# Patient Record
Sex: Female | Born: 1945 | Race: White | Hispanic: No | Marital: Single | Smoking: Never smoker
Health system: Southern US, Community
[De-identification: ages and names within clinical notes are randomized; demographics above are authoritative.]

## PROBLEM LIST (undated history)

## (undated) DIAGNOSIS — I495 Sick sinus syndrome: Secondary | ICD-10-CM

## (undated) DIAGNOSIS — I499 Cardiac arrhythmia, unspecified: Secondary | ICD-10-CM

## (undated) DIAGNOSIS — Z87898 Personal history of other specified conditions: Secondary | ICD-10-CM

## (undated) DIAGNOSIS — R55 Syncope and collapse: Secondary | ICD-10-CM

## (undated) DIAGNOSIS — S069X9A Unspecified intracranial injury with loss of consciousness of unspecified duration, initial encounter: Secondary | ICD-10-CM

---

## 2002-04-15 ENCOUNTER — Encounter: Admission: RE | Admit: 2002-04-15 | Discharge: 2002-04-15 | Payer: Self-pay | Admitting: Internal Medicine

## 2002-04-15 ENCOUNTER — Encounter: Payer: Self-pay | Admitting: Internal Medicine

## 2002-04-17 ENCOUNTER — Encounter: Admission: RE | Admit: 2002-04-17 | Discharge: 2002-04-17 | Payer: Self-pay | Admitting: Internal Medicine

## 2002-04-17 ENCOUNTER — Encounter: Payer: Self-pay | Admitting: Internal Medicine

## 2004-07-22 ENCOUNTER — Encounter: Admission: RE | Admit: 2004-07-22 | Discharge: 2004-07-22 | Payer: Self-pay | Admitting: Internal Medicine

## 2005-08-01 ENCOUNTER — Encounter: Admission: RE | Admit: 2005-08-01 | Discharge: 2005-08-01 | Payer: Self-pay | Admitting: Internal Medicine

## 2006-08-14 ENCOUNTER — Encounter: Admission: RE | Admit: 2006-08-14 | Discharge: 2006-08-14 | Payer: Self-pay | Admitting: Internal Medicine

## 2011-06-09 ENCOUNTER — Ambulatory Visit (INDEPENDENT_AMBULATORY_CARE_PROVIDER_SITE_OTHER): Payer: BC Managed Care – PPO

## 2011-06-09 DIAGNOSIS — J209 Acute bronchitis, unspecified: Secondary | ICD-10-CM

## 2013-09-27 ENCOUNTER — Ambulatory Visit: Payer: BC Managed Care – PPO

## 2013-09-27 ENCOUNTER — Ambulatory Visit (INDEPENDENT_AMBULATORY_CARE_PROVIDER_SITE_OTHER): Payer: BC Managed Care – PPO | Admitting: Family Medicine

## 2013-09-27 VITALS — BP 110/58 | HR 66 | Temp 97.6°F | Resp 16 | Ht 61.0 in | Wt 165.8 lb

## 2013-09-27 DIAGNOSIS — S9031XA Contusion of right foot, initial encounter: Secondary | ICD-10-CM

## 2013-09-27 DIAGNOSIS — S9030XA Contusion of unspecified foot, initial encounter: Secondary | ICD-10-CM

## 2013-09-27 DIAGNOSIS — M25579 Pain in unspecified ankle and joints of unspecified foot: Secondary | ICD-10-CM

## 2013-09-27 NOTE — Progress Notes (Signed)
[facility] [address], [place] [address] [phone]  Date:  09/27/2013   Name:  [name] Perkins   DOB:  05-Nov-1945   MRN:  [id number]  PCP:  No primary provider on file.    Chief Complaint: Foot Injury   History of Present Illness:  [name] Mckercher is a 68 y.o. very pleasant female patient who presents with the following:  Here today with a right foot injury. She dropped a wooden pallet on her right foot yesterday- she felt that her foot was a lot worse last night.  She wants to be sure all is ok and have an x-ray; however she feels that she is likely all right.  Otherwise she is unhurt and feeling well, she is generally quite healthy.    There are no active problems to display for this patient.   No past medical history on file.  No past surgical history on file.  History  Substance Use Topics  . Smoking status: Never Smoker   . Smokeless tobacco: Not on file  . Alcohol Use: Yes     Comment: WINE    Family History  Problem Relation Age of Onset  . Heart disease Father   . Cancer Sister   . Cancer Brother   . Cancer Maternal Grandmother   . Heart disease Maternal Grandfather   . Stroke Paternal Grandmother     No Known Allergies  Medication list has been reviewed and updated.  No current outpatient prescriptions on file prior to visit.   No current facility-administered medications on file prior to visit.    Review of Systems:  As per HPI- otherwise negative.   Physical Examination: Filed Vitals:   09/27/13 1635  BP: 110/58  Pulse: 66  Temp: 97.6 F (36.4 C)  Resp: 16   Filed Vitals:   09/27/13 1635  Height: 5\' 1"  (1.549 m)  Weight: 165 lb 12.8 oz (75.206 kg)   Body mass index is 31.34 kg/(m^2). Ideal Body Weight: Weight in (lb) to have BMI = 25: 132  GEN: WDWN, NAD, Non-toxic, A & O x 3, looks well HEENT: Atraumatic, Normocephalic. Neck supple. No masses, No LAD. Ears and Nose: No external deformity. CV: RRR,  No M/G/R. No JVD. No thrill. No extra heart sounds. PULM: CTA B, no wheezes, crackles, rhonchi. No retractions. No resp. distress. No accessory muscle use. EXTR: No c/c/e NEURO Normal gait. Is not favoring foot PSYCH: Normally interactive. Conversant. Not depressed or anxious appearing.  Calm demeanor.  Right foot: she has a contused and slightly tender area over the right 1st MCT. No swelling or bruise.  Ankle and knee negative/  No laceration  [facility] reading (PRIMARY) by  Dr. Patsy Lageropland. Right foot: negative   Assessment and Plan: Pain in joint, ankle and foot - Plan: DG Foot Complete Right  Contusion of right foot  Reassurance.  She does not desire crutches or other treatment.  She will let me know if not better soon- Sooner if worse.     Signed Abbe AmsterdamJessica Copland, MD

## 2013-09-27 NOTE — Patient Instructions (Signed)
Let us know if your foot does not continue to improve over next few days- ice and elevate when you can.

## 2014-05-19 ENCOUNTER — Other Ambulatory Visit (HOSPITAL_COMMUNITY): Payer: Self-pay | Admitting: Respiratory Therapy

## 2014-05-19 DIAGNOSIS — G4730 Sleep apnea, unspecified: Secondary | ICD-10-CM

## 2014-05-20 ENCOUNTER — Ambulatory Visit: Payer: Medicare Other | Attending: Internal Medicine | Admitting: Sleep Medicine

## 2014-05-20 DIAGNOSIS — R0683 Snoring: Secondary | ICD-10-CM | POA: Diagnosis present

## 2014-05-20 DIAGNOSIS — G4730 Sleep apnea, unspecified: Secondary | ICD-10-CM

## 2014-05-20 DIAGNOSIS — G4700 Insomnia, unspecified: Secondary | ICD-10-CM | POA: Insufficient documentation

## 2014-05-23 NOTE — Sleep Study (Signed)
  [facility] Cleto Claggett A. Gerilyn Pilgrimoonquah, MD     [email or web address]        NOCTURNAL POLYSOMNOGRAM    LOCATION: SLEEP LAB FACILITY: [facility]   PHYSICIAN: Bernisha Verma A. Gerilyn Pilgrimoonquah, M.D.   DATE OF STUDY: 05/20/2014.   REFERRING PHYSICIAN: Margaretmary BayleyPreston Clark.   INDICATIONS: The patient is a 68 year old female who presents with witnessed apneas, snoring and insomnia.  MEDICATIONS:  Prior to Admission medications   Medication Sig Start Date End Date Taking? Authorizing Provider  Ascorbic Acid (VITAMIN C PO) Take by mouth as needed.    Historical Provider, MD  Multiple Vitamin (MULTIVITAMIN) tablet Take 1 tablet by mouth daily.    Historical Provider, MD  Pyridoxine HCl (VITAMIN B-6 PO) Take by mouth.    Historical Provider, MD      EPWORTH SLEEPINESS SCALE: 6.   BMI: 27.   ARCHITECTURAL SUMMARY: There were two recordings which had to be spliced together because the initial recording was associated with technical difficulties. The combined total recording time was 462 minutes. Sleep efficiency 94 %. Sleep latency 19 minutes. REM latency 48 minutes. Staging based on both recordings are follows: Stage NI 5 %, N2 59 % and N3 1.5 % and REM sleep 23 %.    RESPIRATORY DATA:  Baseline oxygen saturation is 98 %. The lowest saturation is 89 %. The diagnostic AHI is 4. The RDI is 4. The REM AHI is 7.  LIMB MOVEMENT SUMMARY: PLM index 4.   ELECTROCARDIOGRAM SUMMARY: Average heart rate is 63 with no significant dysrhythmias observed.   IMPRESSION:  1. Unremarkable nocturnal polysomnography.  Thanks for this referral.  Cleona Doubleday A. Gerilyn Pilgrimoonquah, M.D. Diplomat, Biomedical engineerAmerican Board of Sleep Medicine.

## 2014-06-23 ENCOUNTER — Encounter (HOSPITAL_COMMUNITY): Payer: Self-pay | Admitting: Emergency Medicine

## 2014-06-23 ENCOUNTER — Emergency Department (HOSPITAL_COMMUNITY): Payer: Medicare Other

## 2014-06-23 ENCOUNTER — Inpatient Hospital Stay (HOSPITAL_COMMUNITY)
Admission: EM | Admit: 2014-06-23 | Discharge: 2014-06-25 | DRG: 242 | Disposition: A | Discharge status: Home / Self Care | Payer: Medicare Other | Attending: General Surgery | Admitting: General Surgery

## 2014-06-23 DIAGNOSIS — S52501A Unspecified fracture of the lower end of right radius, initial encounter for closed fracture: Secondary | ICD-10-CM

## 2014-06-23 DIAGNOSIS — S065X0A Traumatic subdural hemorrhage without loss of consciousness, initial encounter: Secondary | ICD-10-CM | POA: Diagnosis present

## 2014-06-23 DIAGNOSIS — I455 Other specified heart block: Secondary | ICD-10-CM | POA: Diagnosis not present

## 2014-06-23 DIAGNOSIS — S065X9A Traumatic subdural hemorrhage with loss of consciousness of unspecified duration, initial encounter: Secondary | ICD-10-CM | POA: Diagnosis present

## 2014-06-23 DIAGNOSIS — E876 Hypokalemia: Secondary | ICD-10-CM | POA: Diagnosis present

## 2014-06-23 DIAGNOSIS — S065XAA Traumatic subdural hemorrhage with loss of consciousness status unknown, initial encounter: Secondary | ICD-10-CM

## 2014-06-23 DIAGNOSIS — Y92009 Unspecified place in unspecified non-institutional (private) residence as the place of occurrence of the external cause: Secondary | ICD-10-CM

## 2014-06-23 DIAGNOSIS — R001 Bradycardia, unspecified: Secondary | ICD-10-CM | POA: Diagnosis present

## 2014-06-23 DIAGNOSIS — Z950 Presence of cardiac pacemaker: Secondary | ICD-10-CM

## 2014-06-23 DIAGNOSIS — W109XXA Fall (on) (from) unspecified stairs and steps, initial encounter: Secondary | ICD-10-CM | POA: Diagnosis present

## 2014-06-23 DIAGNOSIS — W19XXXA Unspecified fall, initial encounter: Secondary | ICD-10-CM

## 2014-06-23 DIAGNOSIS — S62109A Fracture of unspecified carpal bone, unspecified wrist, initial encounter for closed fracture: Secondary | ICD-10-CM | POA: Diagnosis present

## 2014-06-23 DIAGNOSIS — R55 Syncope and collapse: Secondary | ICD-10-CM | POA: Diagnosis not present

## 2014-06-23 DIAGNOSIS — I469 Cardiac arrest, cause unspecified: Secondary | ICD-10-CM | POA: Diagnosis not present

## 2014-06-23 DIAGNOSIS — T07XXXA Unspecified multiple injuries, initial encounter: Secondary | ICD-10-CM | POA: Diagnosis present

## 2014-06-23 DIAGNOSIS — S62101A Fracture of unspecified carpal bone, right wrist, initial encounter for closed fracture: Secondary | ICD-10-CM

## 2014-06-23 DIAGNOSIS — R40241 Glasgow coma scale score 13-15: Secondary | ICD-10-CM | POA: Diagnosis present

## 2014-06-23 DIAGNOSIS — S0181XA Laceration without foreign body of other part of head, initial encounter: Secondary | ICD-10-CM | POA: Diagnosis present

## 2014-06-23 DIAGNOSIS — I495 Sick sinus syndrome: Secondary | ICD-10-CM | POA: Diagnosis present

## 2014-06-23 LAB — I-STAT CHEM 8, ED
BUN: 23 mg/dL (ref 6–23)
CALCIUM ION: 1.01 mmol/L — AB (ref 1.13–1.30)
Chloride: 108 mEq/L (ref 96–112)
Creatinine, Ser: 0.8 mg/dL (ref 0.50–1.10)
Glucose, Bld: 98 mg/dL (ref 70–99)
HCT: 40 % (ref 36.0–46.0)
Hemoglobin: 13.6 g/dL (ref 12.0–15.0)
Potassium: 3.2 mmol/L — ABNORMAL LOW (ref 3.5–5.1)
SODIUM: 138 mmol/L (ref 135–145)
TCO2: 23 mmol/L (ref 0–100)

## 2014-06-23 LAB — I-STAT TROPONIN, ED: TROPONIN I, POC: 0 ng/mL (ref 0.00–0.08)

## 2014-06-23 MED ORDER — METOCLOPRAMIDE HCL 5 MG/ML IJ SOLN
10.0000 mg | Freq: Once | INTRAMUSCULAR | Status: AC
  Administered 2014-06-24: 10 mg via INTRAVENOUS
  Filled 2014-06-23: qty 2

## 2014-06-23 MED ORDER — HYDROMORPHONE HCL 1 MG/ML IJ SOLN
0.5000 mg | Freq: Once | INTRAMUSCULAR | Status: AC
  Administered 2014-06-23: 0.5 mg via INTRAVENOUS
  Filled 2014-06-23: qty 1

## 2014-06-23 MED ORDER — DIPHENHYDRAMINE HCL 50 MG/ML IJ SOLN
12.5000 mg | Freq: Once | INTRAMUSCULAR | Status: AC
  Administered 2014-06-24: 12.5 mg via INTRAVENOUS
  Filled 2014-06-23: qty 1

## 2014-06-23 MED ORDER — ONDANSETRON HCL 4 MG/2ML IJ SOLN
4.0000 mg | Freq: Once | INTRAMUSCULAR | Status: AC
  Administered 2014-06-23: 4 mg via INTRAVENOUS
  Filled 2014-06-23: qty 2

## 2014-06-23 NOTE — ED Provider Notes (Signed)
CSN: [id number]     Arrival date & time 06/23/14  2233 History   First MD Initiated Contact with Patient 06/23/14 2251     Chief Complaint  Patient presents with  . Fall     (Consider location/radiation/quality/duration/timing/severity/associated sxs/prior Treatment) HPI Comments: This is a 69 year old female who lives alone.  She called a friend after falling down 10 wooden steps in her house, does not recall the exact circumstances of her fall.  She has not been feeling ill.  She denies any chest pain, shortness of breath, fever, URI symptoms at this time.  She has a laceration over her right eyebrow that has been dressed by EMS.  She also has right wrist pain Patient denies back pain, dizziness, visual change, headache, abdominal pain, pain in her legs, hips or back  Patient is a 69 y.o. female presenting with fall. The history is provided by the patient.  Fall This is a new problem. The current episode started today. The problem occurs constantly. The problem has been unchanged. Associated symptoms include nausea. Pertinent negatives include no abdominal pain, change in bowel habit, chest pain, congestion, coughing, fever, headaches, neck pain, numbness, vertigo, visual change or weakness. The treatment provided no relief.    History reviewed. No pertinent past medical history. History reviewed. No pertinent past surgical history. Family History  Problem Relation Age of Onset  . Heart disease Father   . Cancer Sister   . Cancer Brother   . Cancer Maternal Grandmother   . Heart disease Maternal Grandfather   . Stroke Paternal Grandmother    History  Substance Use Topics  . Smoking status: Never Smoker   . Smokeless tobacco: Not on file  . Alcohol Use: Yes     Comment: WINE   OB History    No data available     Review of Systems  Constitutional: Negative for fever.  HENT: Negative for congestion and rhinorrhea.   Respiratory: Negative for cough.   Cardiovascular: Negative  for chest pain.  Gastrointestinal: Positive for nausea. Negative for abdominal pain and change in bowel habit.  Musculoskeletal: Negative for back pain and neck pain.  Skin: Positive for wound.  Neurological: Negative for dizziness, vertigo, weakness, numbness and headaches.  All other systems reviewed and are negative.     Allergies  Review of patient's allergies indicates no known allergies.  Home Medications   Prior to Admission medications   Medication Sig Start Date End Date Taking? Authorizing Provider  Ascorbic Acid (VITAMIN C PO) Take by mouth as needed.    Historical Provider, MD  Multiple Vitamin (MULTIVITAMIN) tablet Take 1 tablet by mouth daily.    Historical Provider, MD  Pyridoxine HCl (VITAMIN B-6 PO) Take by mouth.    Historical Provider, MD   BP 120/55 mmHg  Pulse 89  Temp(Src) 97.3 F (36.3 C) (Oral)  Resp 12  SpO2 100% Physical Exam  Constitutional: She is oriented to person, place, and time. She appears well-developed and well-nourished.  HENT:  Head: Normocephalic.  Eyes: Pupils are equal, round, and reactive to light.  Neck:  In C Collar  Cardiovascular: Normal rate.   Pulmonary/Chest: Effort normal and breath sounds normal. She has no wheezes. She exhibits no tenderness.  Abdominal: Soft. Bowel sounds are normal. There is no tenderness.  Musculoskeletal:       Right wrist: She exhibits decreased range of motion and swelling.  Neurological: She is alert and oriented to person, place, and time.  Skin: Skin is warm.  Nursing note and vitals reviewed.   ED Course  LACERATION REPAIR Date/Time: 06/24/2014 2:07 AM Performed by: Arman Filter Authorized by: Arman Filter Consent: Verbal consent obtained. Written consent not obtained. Risks and benefits: risks, benefits and alternatives were discussed Consent given by: patient Patient understanding: patient states understanding of the procedure being performed Patient identity confirmed:  verbally with patient Time out: Immediately prior to procedure a "time out" was called to verify the correct patient, procedure, equipment, support staff and site/side marked as required. Body area: head/neck Laceration length: 3 cm Foreign bodies: unknown Tendon involvement: none Nerve involvement: none Vascular damage: no Anesthesia: local infiltration Local anesthetic: lidocaine 1% without epinephrine Anesthetic total: 2 ml Patient sedated: no Preparation: Patient was prepped and draped in the usual sterile fashion. Irrigation solution: saline Amount of cleaning: standard Debridement: none Degree of undermining: none Skin closure: 6-0 Prolene Subcutaneous closure: 4-0 Vicryl Number of sutures: 10 Technique: simple Approximation: close Approximation difficulty: simple Dressing: antibiotic ointment Patient tolerance: Patient tolerated the procedure well with no immediate complications   (including critical care time) Labs Review Labs Reviewed  CBC WITH DIFFERENTIAL - Abnormal; Notable for the following:    WBC 13.2 (*)    Neutrophils Relative % 91 (*)    Lymphocytes Relative 6 (*)    Neutro Abs 12.0 (*)    All other components within normal limits  I-STAT CHEM 8, ED - Abnormal; Notable for the following:    Potassium 3.2 (*)    Calcium, Ion 1.01 (*)    All other components within normal limits  PROTIME-INR  HEPATIC FUNCTION PANEL  I-STAT TROPOININ, ED    Imaging Review Dg Wrist Complete Right  06/23/2014   CLINICAL DATA:  Right wrist pain, bruising, deformity. Fall at home. Initial encounter.  EXAM: RIGHT WRIST - COMPLETE 3+ VIEW  COMPARISON:  None.  FINDINGS: Comminuted intra-articular distal radius fracture extending into the distal radial ulnar and radiocarpal joints. There is 1/2 shaft with dorsal displacement of distal fracture fragments and mild apex volar angulation. There is a mildly displaced ulnar styloid fracture. Associated soft tissue edema is seen about the  wrist. Carpal alignment is maintained.  IMPRESSION: Comminuted displaced angulated distal radius fracture extending into the distal radial ulnar and radiocarpal joints. Mildly displaced ulna styloid fracture.   Electronically Signed   By: Rubye Oaks M.D.   On: 06/23/2014 23:55   Ct Head Wo Contrast  06/24/2014   CLINICAL DATA:  Larey Seat down 10 steps at house today. Patient does not recall accident.  EXAM: CT HEAD WITHOUT CONTRAST  CT CERVICAL SPINE WITHOUT CONTRAST  TECHNIQUE: Multidetector CT imaging of the head and cervical spine was performed following the standard protocol without intravenous contrast. Multiplanar CT image reconstructions of the cervical spine were also generated.  COMPARISON:  None.  FINDINGS: CT HEAD FINDINGS  The ventricles and sulci are normal for age. No intraparenchymal hemorrhage, mass effect nor midline shift. Patchy supratentorial white matter hypodensities are within normal range for patient's age and though non-specific suggest sequelae of chronic small vessel ischemic disease. No acute large vascular territory infarcts.  3 mm crescentic density within RIGHT frontal extra-axial space, axial 15/31. Basal cisterns are patent. Moderate calcific atherosclerosis of the carotid siphons.  Moderate RIGHT frontal scalp hematoma with subcutaneous gas, no radiopaque foreign bodies. No skull fracture. The included ocular globes and orbital contents are non-suspicious. The mastoid aircells and included paranasal sinuses are well-aerated.  CT CERVICAL SPINE FINDINGS  Cervical vertebral bodies  and posterior elements are intact and aligned. Straightened cervical lordosis. Moderate to severe C6-7 degenerative disc, moderate at C4-5 and C5-6. No destructive bony lesions. C1-2 articulation maintained with moderate arthropathy. Calcified longus colli insertion. No destructive bony lesions.  Broad-based disc osteophyte complex, mild facet arthropathy without osseous canal stenosis. Severe LEFT C5-6,  moderate to severe RIGHT C6-7 neural foraminal narrowing.  IMPRESSION: CT HEAD: 3 mm RIGHT frontal extra-axial density, though this may reflect a cortical vein, in the setting of trauma, subdural hematoma is a concern. No skull fracture.  Moderate RIGHT frontal scalp hematoma, laceration.  CT CERVICAL SPINE: Straightened cervical lordosis without acute fracture or malalignment.  Acute findings discussed with and reconfirmed by [facility] Jost on 06/23/2014 at 12:00 am.   Electronically Signed   By: Awilda Metroourtnay  Bloomer   On: 06/24/2014 00:00   Ct Cervical Spine Wo Contrast  06/24/2014   CLINICAL DATA:  Larey SeatFell down 10 steps at house today. Patient does not recall accident.  EXAM: CT HEAD WITHOUT CONTRAST  CT CERVICAL SPINE WITHOUT CONTRAST  TECHNIQUE: Multidetector CT imaging of the head and cervical spine was performed following the standard protocol without intravenous contrast. Multiplanar CT image reconstructions of the cervical spine were also generated.  COMPARISON:  None.  FINDINGS: CT HEAD FINDINGS  The ventricles and sulci are normal for age. No intraparenchymal hemorrhage, mass effect nor midline shift. Patchy supratentorial white matter hypodensities are within normal range for patient's age and though non-specific suggest sequelae of chronic small vessel ischemic disease. No acute large vascular territory infarcts.  3 mm crescentic density within RIGHT frontal extra-axial space, axial 15/31. Basal cisterns are patent. Moderate calcific atherosclerosis of the carotid siphons.  Moderate RIGHT frontal scalp hematoma with subcutaneous gas, no radiopaque foreign bodies. No skull fracture. The included ocular globes and orbital contents are non-suspicious. The mastoid aircells and included paranasal sinuses are well-aerated.  CT CERVICAL SPINE FINDINGS  Cervical vertebral bodies and posterior elements are intact and aligned. Straightened cervical lordosis. Moderate to severe C6-7 degenerative disc, moderate at C4-5  and C5-6. No destructive bony lesions. C1-2 articulation maintained with moderate arthropathy. Calcified longus colli insertion. No destructive bony lesions.  Broad-based disc osteophyte complex, mild facet arthropathy without osseous canal stenosis. Severe LEFT C5-6, moderate to severe RIGHT C6-7 neural foraminal narrowing.  IMPRESSION: CT HEAD: 3 mm RIGHT frontal extra-axial density, though this may reflect a cortical vein, in the setting of trauma, subdural hematoma is a concern. No skull fracture.  Moderate RIGHT frontal scalp hematoma, laceration.  CT CERVICAL SPINE: Straightened cervical lordosis without acute fracture or malalignment.  Acute findings discussed with and reconfirmed by Dr.Aleni Andrus on 06/23/2014 at 12:00 am.   Electronically Signed   By: Awilda Metroourtnay  Bloomer   On: 06/24/2014 00:00     EKG Interpretation   Date/Time:  Wednesday June 24 2014 00:26:20 EST Ventricular Rate:  91 PR Interval:  243 QRS Duration: 72 QT Interval:  412 QTC Calculation: 507 R Axis:   70 Text Interpretation:  Sinus rhythm Prolonged PR interval Prolonged QT  interval Confirmed by Erroll Lunani, Adeleke Ayokunle [phone]([id number]) on 06/24/2014 2:11:08 AM     I spoke with Dr. Merlyn LotKuzma requests a sugar tong splint be placed in sling.  He will evaluate the patient in the morning.  Potential surgical repair of her fracture.  She is neurovascularly intact. I spoke with Dr. Magnus IvanBlackman who will admit the patient to trauma because of multisystem injury Spoke with Dr. Toula MoosNundkumar-neurosurgery.  He states the patient will  need to be admitted overnight with frequent neuro checks and reevaluated in the morning.  Most likely with a repeat CT scan to evaluate subdural hematoma MDM   Final diagnoses:  Fall at home, initial encounter  Subdural hematoma  Wrist fracture, right, closed, initial encounter  Facial laceration, initial encounter         Arman Filter, NP 06/24/14 1610  Tomasita Crumble, MD 06/24/14 [phone]

## 2014-06-23 NOTE — ED Notes (Signed)
Pt arrives via [facility] on back board with c/collar and head blocks. Pt fell down about 10 wooden steps at her house, only remember working on a puzzle beforehand, unsure of why she fell. Pt has bandaged lac above rt eye and rt wrist splinted. Pt alert, oriented x4. Iv in place, 4mg  zofran given via ems, pt still c/o nausea.

## 2014-06-23 NOTE — ED Notes (Signed)
PA at bedside.

## 2014-06-23 NOTE — ED Notes (Signed)
Pt back to room from radiology, vomiting at this time. PA made aware.

## 2014-06-24 ENCOUNTER — Inpatient Hospital Stay (HOSPITAL_COMMUNITY): Payer: Medicare Other

## 2014-06-24 ENCOUNTER — Encounter (HOSPITAL_COMMUNITY): Payer: Self-pay | Admitting: Internal Medicine

## 2014-06-24 ENCOUNTER — Encounter (HOSPITAL_COMMUNITY): Admission: EM | Disposition: A | Discharge status: Home / Self Care | Payer: Self-pay | Source: Home / Self Care

## 2014-06-24 DIAGNOSIS — R001 Bradycardia, unspecified: Secondary | ICD-10-CM | POA: Diagnosis present

## 2014-06-24 DIAGNOSIS — T07XXXA Unspecified multiple injuries, initial encounter: Secondary | ICD-10-CM | POA: Diagnosis present

## 2014-06-24 DIAGNOSIS — Y92009 Unspecified place in unspecified non-institutional (private) residence as the place of occurrence of the external cause: Secondary | ICD-10-CM | POA: Diagnosis not present

## 2014-06-24 DIAGNOSIS — I495 Sick sinus syndrome: Secondary | ICD-10-CM

## 2014-06-24 DIAGNOSIS — I455 Other specified heart block: Principal | ICD-10-CM

## 2014-06-24 DIAGNOSIS — W109XXA Fall (on) (from) unspecified stairs and steps, initial encounter: Secondary | ICD-10-CM | POA: Diagnosis present

## 2014-06-24 DIAGNOSIS — I469 Cardiac arrest, cause unspecified: Secondary | ICD-10-CM | POA: Diagnosis not present

## 2014-06-24 DIAGNOSIS — E876 Hypokalemia: Secondary | ICD-10-CM | POA: Diagnosis present

## 2014-06-24 DIAGNOSIS — R55 Syncope and collapse: Secondary | ICD-10-CM | POA: Diagnosis present

## 2014-06-24 DIAGNOSIS — S065X0A Traumatic subdural hemorrhage without loss of consciousness, initial encounter: Secondary | ICD-10-CM | POA: Diagnosis present

## 2014-06-24 DIAGNOSIS — S52501A Unspecified fracture of the lower end of right radius, initial encounter for closed fracture: Secondary | ICD-10-CM | POA: Diagnosis present

## 2014-06-24 DIAGNOSIS — S0181XA Laceration without foreign body of other part of head, initial encounter: Secondary | ICD-10-CM | POA: Diagnosis present

## 2014-06-24 DIAGNOSIS — R40241 Glasgow coma scale score 13-15: Secondary | ICD-10-CM | POA: Diagnosis present

## 2014-06-24 HISTORY — PX: PERMANENT PACEMAKER INSERTION: SHX5480

## 2014-06-24 LAB — MRSA PCR SCREENING: MRSA by PCR: NEGATIVE

## 2014-06-24 LAB — PROTIME-INR
INR: 0.93 (ref 0.00–1.49)
Prothrombin Time: 12.6 seconds (ref 11.6–15.2)

## 2014-06-24 LAB — CBC WITH DIFFERENTIAL/PLATELET
BASOS ABS: 0 10*3/uL (ref 0.0–0.1)
Basophils Relative: 0 % (ref 0–1)
Eosinophils Absolute: 0 10*3/uL (ref 0.0–0.7)
Eosinophils Relative: 0 % (ref 0–5)
HCT: 40 % (ref 36.0–46.0)
HEMOGLOBIN: 13.5 g/dL (ref 12.0–15.0)
Lymphocytes Relative: 6 % — ABNORMAL LOW (ref 12–46)
Lymphs Abs: 0.8 10*3/uL (ref 0.7–4.0)
MCH: 30.6 pg (ref 26.0–34.0)
MCHC: 33.8 g/dL (ref 30.0–36.0)
MCV: 90.7 fL (ref 78.0–100.0)
Monocytes Absolute: 0.4 10*3/uL (ref 0.1–1.0)
Monocytes Relative: 3 % (ref 3–12)
NEUTROS ABS: 12 10*3/uL — AB (ref 1.7–7.7)
Neutrophils Relative %: 91 % — ABNORMAL HIGH (ref 43–77)
Platelets: 296 10*3/uL (ref 150–400)
RBC: 4.41 MIL/uL (ref 3.87–5.11)
RDW: 13 % (ref 11.5–15.5)
WBC: 13.2 10*3/uL — ABNORMAL HIGH (ref 4.0–10.5)

## 2014-06-24 LAB — HEPATIC FUNCTION PANEL
ALT: 15 U/L (ref 0–35)
AST: 43 U/L — ABNORMAL HIGH (ref 0–37)
Albumin: 4.2 g/dL (ref 3.5–5.2)
Alkaline Phosphatase: 68 U/L (ref 39–117)
BILIRUBIN INDIRECT: 0.5 mg/dL (ref 0.3–0.9)
BILIRUBIN TOTAL: 1 mg/dL (ref 0.3–1.2)
Bilirubin, Direct: 0.5 mg/dL — ABNORMAL HIGH (ref 0.0–0.3)
TOTAL PROTEIN: 6.7 g/dL (ref 6.0–8.3)

## 2014-06-24 SURGERY — PERMANENT PACEMAKER INSERTION

## 2014-06-24 MED ORDER — MIDAZOLAM HCL 5 MG/5ML IJ SOLN
INTRAMUSCULAR | Status: AC
  Filled 2014-06-24: qty 5

## 2014-06-24 MED ORDER — SODIUM CHLORIDE 0.9 % IV SOLN: 1.0000 g | Freq: Once | INTRAVENOUS | Status: DC

## 2014-06-24 MED ORDER — FENTANYL CITRATE 0.05 MG/ML IJ SOLN
INTRAMUSCULAR | Status: AC
  Filled 2014-06-24: qty 2

## 2014-06-24 MED ORDER — LIDOCAINE HCL (PF) 1 % IJ SOLN
5.0000 mL | Freq: Once | INTRAMUSCULAR | Status: AC
  Administered 2014-06-24: 5 mL
  Filled 2014-06-24: qty 5

## 2014-06-24 MED ORDER — SODIUM CHLORIDE 0.9 % IV SOLN: INTRAVENOUS | Status: DC

## 2014-06-24 MED ORDER — MORPHINE SULFATE 2 MG/ML IJ SOLN
2.0000 mg | INTRAMUSCULAR | Status: DC | PRN
  Administered 2014-06-25: 2 mg via INTRAVENOUS
  Filled 2014-06-24: qty 1

## 2014-06-24 MED ORDER — CEFAZOLIN SODIUM-DEXTROSE 2-3 GM-% IV SOLR
2.0000 g | INTRAVENOUS | Status: DC
  Filled 2014-06-24: qty 50

## 2014-06-24 MED ORDER — CEFAZOLIN SODIUM 1-5 GM-% IV SOLN
1.0000 g | Freq: Four times a day (QID) | INTRAVENOUS | Status: DC
  Administered 2014-06-24 – 2014-06-25 (×2): 1 g via INTRAVENOUS
  Filled 2014-06-24 (×3): qty 50

## 2014-06-24 MED ORDER — ONDANSETRON HCL 4 MG/2ML IJ SOLN: 4.0000 mg | Freq: Four times a day (QID) | INTRAMUSCULAR | Status: DC | PRN

## 2014-06-24 MED ORDER — CHLORHEXIDINE GLUCONATE 4 % EX LIQD
60.0000 mL | Freq: Once | CUTANEOUS | Status: AC
Start: 2014-06-24 — End: 2014-06-24
  Filled 2014-06-24: qty 60

## 2014-06-24 MED ORDER — HYDROMORPHONE HCL 1 MG/ML IJ SOLN
0.5000 mg | Freq: Once | INTRAMUSCULAR | Status: AC
  Administered 2014-06-24: 0.5 mg via INTRAVENOUS
  Filled 2014-06-24: qty 1

## 2014-06-24 MED ORDER — CETYLPYRIDINIUM CHLORIDE 0.05 % MT LIQD: 7.0000 mL | Freq: Two times a day (BID) | OROMUCOSAL | Status: DC

## 2014-06-24 MED ORDER — SODIUM CHLORIDE 0.9 % IV SOLN
1.0000 g | Freq: Once | INTRAVENOUS | Status: AC
  Administered 2014-06-24: 1 g via INTRAVENOUS
  Filled 2014-06-24: qty 10

## 2014-06-24 MED ORDER — GENTAMICIN SULFATE 40 MG/ML IJ SOLN
80.0000 mg | INTRAMUSCULAR | Status: DC
  Filled 2014-06-24 (×2): qty 2

## 2014-06-24 MED ORDER — HYDROMORPHONE HCL 1 MG/ML IJ SOLN: 1.0000 mg | INTRAMUSCULAR | Status: DC | PRN

## 2014-06-24 MED ORDER — HEPARIN (PORCINE) IN NACL 2-0.9 UNIT/ML-% IJ SOLN
INTRAMUSCULAR | Status: AC
  Filled 2014-06-24: qty 500

## 2014-06-24 MED ORDER — SODIUM CHLORIDE 0.9 % IV BOLUS (SEPSIS)
250.0000 mL | Freq: Once | INTRAVENOUS | Status: AC
  Administered 2014-06-24: 250 mL via INTRAVENOUS

## 2014-06-24 MED ORDER — ACETAMINOPHEN 325 MG PO TABS
325.0000 mg | ORAL_TABLET | ORAL | Status: DC | PRN
  Administered 2014-06-24: 650 mg via ORAL
  Filled 2014-06-24: qty 2

## 2014-06-24 MED ORDER — LIDOCAINE HCL (PF) 1 % IJ SOLN
INTRAMUSCULAR | Status: AC
  Filled 2014-06-24: qty 60

## 2014-06-24 MED ORDER — ONDANSETRON HCL 4 MG PO TABS: 4.0000 mg | ORAL_TABLET | Freq: Four times a day (QID) | ORAL | Status: DC | PRN

## 2014-06-24 MED ORDER — CHLORHEXIDINE GLUCONATE 4 % EX LIQD
60.0000 mL | Freq: Once | CUTANEOUS | Status: AC
Start: 2014-06-24 — End: 2014-06-24
  Administered 2014-06-24: 4 via TOPICAL
  Filled 2014-06-24: qty 60

## 2014-06-24 MED ORDER — POTASSIUM CHLORIDE IN NACL 20-0.9 MEQ/L-% IV SOLN
INTRAVENOUS | Status: DC
  Administered 2014-06-24 (×2): via INTRAVENOUS
  Filled 2014-06-24 (×5): qty 1000

## 2014-06-24 MED ORDER — MORPHINE SULFATE 2 MG/ML IJ SOLN: 1.0000 mg | INTRAMUSCULAR | Status: DC | PRN

## 2014-06-24 MED ORDER — MORPHINE SULFATE 4 MG/ML IJ SOLN: 4.0000 mg | INTRAMUSCULAR | Status: DC | PRN

## 2014-06-24 NOTE — ED Notes (Signed)
Suture cart placed at bedside. 

## 2014-06-24 NOTE — Progress Notes (Signed)
Patient had a significant bradycardia episode earlier this AM.  Will give cardiology a call for consultation.  This patient has been seen and I agree with the findings and treatment plan.  Marta LamasJames O. Gae BonWyatt, III, MD, FACS [phone] (pager) [phone] (direct pager) Trauma Surgeon

## 2014-06-24 NOTE — CV Procedure (Signed)
SURGEON:  Lewayne BuntingGregg Marvis Saefong, MD     PREPROCEDURE DIAGNOSIS:  Symptomatic Bradycardia due to sinus node dysfunction    POSTPROCEDURE DIAGNOSIS:  Same as preprocedure diagnosis     PROCEDURES:   1.  Pacemaker implantation.     INTRODUCTION: [name] is a 69 y.o. female  with a history of bradycardia who presents today for pacemaker implantation.  The patient reports intermittent episodes of dizziness over the past few months.  No reversible causes have been identified.  She has had a 16 second pause with no reversible causes. The patient therefore presents today for pacemaker implantation.     DESCRIPTION OF PROCEDURE:  Informed written consent was obtained, and   the patient was brought to the electrophysiology lab in a fasting state.  The patient required no sedation for the procedure today.  The patients left chest was prepped and draped in the usual sterile fashion by the EP lab staff. The skin overlying the left deltopectoral region was infiltrated with lidocaine for local analgesia.  A 4-cm incision was made over the left deltopectoral region.  A left subcutaneous pacemaker pocket was fashioned using a combination of sharp and blunt dissection. Electrocautery was required to assure hemostasis.     RA/RV Lead Placement: The left axillary vein was therefore directly visualized and cannulated.  Through the left axillary vein, a St. Jude [id number] (serial number [id number]) right atrial lead and a [employer]. Jude (serial number [id number]) right ventricular lead were advanced with fluoroscopic visualization into the right atrial appendage and right ventricular apical septal positions respectively.  Initial atrial lead P- waves measured 2 mV with impedance of 415 ohms and a threshold of 0.6 V at 0.5 msec.  Right ventricular lead R-waves measured 8 mV with an impedance of 737 ohms and a threshold of 0.9 V at 0.5 msec.  Both leads were secured to the pectoralis fascia using #2-0 silk over the suture sleeves.    Device Placement:  The leads were then connected to a St. Jude (serial number [id number]) pacemaker.  The pocket was irrigated with copious gentamicin solution.  The pacemaker was then placed into the pocket.  The pocket was then closed in 2 layers with 2.0 Vicryl suture for the subcutaneous and subcuticular layers.  Steri-Strips and a sterile dressing were then applied.  There were no early apparent complications.     CONCLUSIONS:   1. Successful implantation of a St. Jude dual-chamber pacemaker for symptomatic bradycardia due to sinus node dysfunction.  2. No early apparent complications.           Lewayne BuntingGregg Darriel Sinquefield, MD 06/24/2014 6:27 PM

## 2014-06-24 NOTE — Consult Note (Signed)
 Reason for Consult: Syncope/ 16 second aystole Referring Physician: Dr. Cooper   HPI: The patient is a 69 y/o female with no prior cardiac history and no significant PMH who presented to [facility] 06/23/14 after sustaining a syncopal episode yesterday at home that resulted in head and right upper extremity trauma. She was home alone and the event was unwitnessed. She recalls that she was in her usual state of health and denies any preceding symptoms. She denies palpitations, dizziness, lightheaded, CP, dyspnea and no presyncope. She denies any prior history of syncope. She was walking up her steps to her second floor bedroom when the event occurred. When she regained consciousness, she was bleeding from her head. She called 911 and was transported to [facility].   On arrival, she was noted to have a small subdural hematoma and a right wrist fracture.  Per admission records, she was in SR with stable HR in the 90s. BP was also stable. She was monitored overnight on telemetry. At 10:08 am today, she was observed to have asystole for a duration of 16.8 seconds. During that time she recalls feeling "poorly". She felt nauseated but no vomiting. Symptoms have since resolved. EP has been consulted for consideration for PPM.  She is currently stable and in NSR with HR in the 70s. She is asymptomatic. No further bradycardia/asystole since episode earlier today. She is not on any AV nodal blocking agents. She had mild hypokalemia on admission at 3.2 but not severe enough to cause this degree of bradycardia. External pacer pads are in place.     History reviewed. No pertinent past medical history.  History reviewed. No pertinent past surgical history.  Family History  Problem Relation Age of Onset  . Heart disease Father   . Cancer Sister   . Cancer Brother   . Cancer Maternal Grandmother   . Heart disease Maternal Grandfather   . Stroke Paternal Grandmother     Social History:  reports that she has never  smoked. She does not have any smokeless tobacco history on file. She reports that she drinks alcohol. Her drug history is not on file.  Allergies: No Known Allergies  Medications: Current Facility-Administered Medications  Medication Dose Route Frequency Provider Last Rate Last Dose  . 0.9 % NaCl with KCl 20 mEq/ L  infusion   Intravenous Continuous Douglas Blackman, MD 75 mL/hr at 06/24/14 0522    . HYDROmorphone (DILAUDID) injection 1 mg  1 mg Intravenous Q4H PRN Douglas Blackman, MD      . morphine 2 MG/ML injection 2 mg  2 mg Intravenous Q1H PRN Douglas Blackman, MD      . morphine 4 MG/ML injection 4 mg  4 mg Intravenous Q1H PRN Douglas Blackman, MD      . ondansetron (ZOFRAN) tablet 4 mg  4 mg Oral Q6H PRN Douglas Blackman, MD       Or  . ondansetron (ZOFRAN) injection 4 mg  4 mg Intravenous Q6H PRN Douglas Blackman, MD         Results for orders placed or performed during the hospital encounter of 06/23/14 (from the past 48 hour(s))  I-stat chem 8, ed     Status: Abnormal   Collection Time: 06/23/14 11:21 PM  Result Value Ref Range   Sodium 138 135 - 145 mmol/L   Potassium 3.2 (L) 3.5 - 5.1 mmol/L   Chloride 108 96 - 112 mEq/L   BUN 23 6 - 23 mg/dL   Creatinine, Ser 0.80 0.50 -   1.10 mg/dL   Glucose, Bld 98 70 - 99 mg/dL   Calcium, Ion 1.01 (L) 1.13 - 1.30 mmol/L   TCO2 23 0 - 100 mmol/L   Hemoglobin 13.6 12.0 - 15.0 g/dL   HCT 40.0 36.0 - 46.0 %  I-stat troponin, ED     Status: None   Collection Time: 06/23/14 11:23 PM  Result Value Ref Range   Troponin i, poc 0.00 0.00 - 0.08 ng/mL   Comment 3            Comment: Due to the release kinetics of cTnI, a negative result within the first hours of the onset of symptoms does not rule out myocardial infarction with certainty. If myocardial infarction is still suspected, repeat the test at appropriate intervals.   CBC with Differential     Status: Abnormal   Collection Time: 06/24/14 12:34 AM  Result Value Ref Range    WBC 13.2 (H) 4.0 - 10.5 K/uL    Comment: WHITE COUNT CONFIRMED ON SMEAR   RBC 4.41 3.87 - 5.11 MIL/uL   Hemoglobin 13.5 12.0 - 15.0 g/dL   HCT 40.0 36.0 - 46.0 %   MCV 90.7 78.0 - 100.0 fL   MCH 30.6 26.0 - 34.0 pg   MCHC 33.8 30.0 - 36.0 g/dL   RDW 13.0 11.5 - 15.5 %   Platelets 296 150 - 400 K/uL   Neutrophils Relative % 91 (H) 43 - 77 %   Lymphocytes Relative 6 (L) 12 - 46 %   Monocytes Relative 3 3 - 12 %   Eosinophils Relative 0 0 - 5 %   Basophils Relative 0 0 - 1 %   Neutro Abs 12.0 (H) 1.7 - 7.7 K/uL   Lymphs Abs 0.8 0.7 - 4.0 K/uL   Monocytes Absolute 0.4 0.1 - 1.0 K/uL   Eosinophils Absolute 0.0 0.0 - 0.7 K/uL   Basophils Absolute 0.0 0.0 - 0.1 K/uL   WBC Morphology      MODERATE LEFT SHIFT (>5% METAS AND MYELOS,OCC PRO NOTED)  Protime-INR     Status: None   Collection Time: 06/24/14 12:34 AM  Result Value Ref Range   Prothrombin Time 12.6 11.6 - 15.2 seconds   INR 0.93 0.00 - 1.49  Hepatic function panel     Status: Abnormal   Collection Time: 06/24/14 12:34 AM  Result Value Ref Range   Total Protein 6.7 6.0 - 8.3 g/dL   Albumin 4.2 3.5 - 5.2 g/dL   AST 43 (H) 0 - 37 U/L   ALT 15 0 - 35 U/L   Alkaline Phosphatase 68 39 - 117 U/L   Total Bilirubin 1.0 0.3 - 1.2 mg/dL   Bilirubin, Direct 0.5 (H) 0.0 - 0.3 mg/dL   Indirect Bilirubin 0.5 0.3 - 0.9 mg/dL  MRSA PCR Screening     Status: None   Collection Time: 06/24/14  5:00 AM  Result Value Ref Range   MRSA by PCR NEGATIVE NEGATIVE    Comment:        The GeneXpert MRSA Assay (FDA approved for NASAL specimens only), is one component of a comprehensive MRSA colonization surveillance program. It is not intended to diagnose MRSA infection nor to guide or monitor treatment for MRSA infections.     Dg Wrist Complete Right  06/23/2014   CLINICAL DATA:  Right wrist pain, bruising, deformity. Fall at home. Initial encounter.  EXAM: RIGHT WRIST - COMPLETE 3+ VIEW  COMPARISON:  None.  FINDINGS: Comminuted  intra-articular distal   radius fracture extending into the distal radial ulnar and radiocarpal joints. There is 1/2 shaft with dorsal displacement of distal fracture fragments and mild apex volar angulation. There is a mildly displaced ulnar styloid fracture. Associated soft tissue edema is seen about the wrist. Carpal alignment is maintained.  IMPRESSION: Comminuted displaced angulated distal radius fracture extending into the distal radial ulnar and radiocarpal joints. Mildly displaced ulna styloid fracture.   Electronically Signed   By: Melanie  Ehinger M.D.   On: 06/23/2014 23:55   Ct Head Wo Contrast  06/24/2014   CLINICAL DATA:  Fell down 10 steps at house today. Patient does not recall accident.  EXAM: CT HEAD WITHOUT CONTRAST  CT CERVICAL SPINE WITHOUT CONTRAST  TECHNIQUE: Multidetector CT imaging of the head and cervical spine was performed following the standard protocol without intravenous contrast. Multiplanar CT image reconstructions of the cervical spine were also generated.  COMPARISON:  None.  FINDINGS: CT HEAD FINDINGS  The ventricles and sulci are normal for age. No intraparenchymal hemorrhage, mass effect nor midline shift. Patchy supratentorial white matter hypodensities are within normal range for patient's age and though non-specific suggest sequelae of chronic small vessel ischemic disease. No acute large vascular territory infarcts.  3 mm crescentic density within RIGHT frontal extra-axial space, axial 15/31. Basal cisterns are patent. Moderate calcific atherosclerosis of the carotid siphons.  Moderate RIGHT frontal scalp hematoma with subcutaneous gas, no radiopaque foreign bodies. No skull fracture. The included ocular globes and orbital contents are non-suspicious. The mastoid aircells and included paranasal sinuses are well-aerated.  CT CERVICAL SPINE FINDINGS  Cervical vertebral bodies and posterior elements are intact and aligned. Straightened cervical lordosis. Moderate to severe C6-7  degenerative disc, moderate at C4-5 and C5-6. No destructive bony lesions. C1-2 articulation maintained with moderate arthropathy. Calcified longus colli insertion. No destructive bony lesions.  Broad-based disc osteophyte complex, mild facet arthropathy without osseous canal stenosis. Severe LEFT C5-6, moderate to severe RIGHT C6-7 neural foraminal narrowing.  IMPRESSION: CT HEAD: 3 mm RIGHT frontal extra-axial density, though this may reflect a cortical vein, in the setting of trauma, subdural hematoma is a concern. No skull fracture.  Moderate RIGHT frontal scalp hematoma, laceration.  CT CERVICAL SPINE: Straightened cervical lordosis without acute fracture or malalignment.  Acute findings discussed with and reconfirmed by Dr.GAIL SCHULZ on 06/23/2014 at 12:00 am.   Electronically Signed   By: Courtnay  Bloomer   On: 06/24/2014 00:00   Ct Cervical Spine Wo Contrast  06/24/2014   CLINICAL DATA:  Fell down 10 steps at house today. Patient does not recall accident.  EXAM: CT HEAD WITHOUT CONTRAST  CT CERVICAL SPINE WITHOUT CONTRAST  TECHNIQUE: Multidetector CT imaging of the head and cervical spine was performed following the standard protocol without intravenous contrast. Multiplanar CT image reconstructions of the cervical spine were also generated.  COMPARISON:  None.  FINDINGS: CT HEAD FINDINGS  The ventricles and sulci are normal for age. No intraparenchymal hemorrhage, mass effect nor midline shift. Patchy supratentorial white matter hypodensities are within normal range for patient's age and though non-specific suggest sequelae of chronic small vessel ischemic disease. No acute large vascular territory infarcts.  3 mm crescentic density within RIGHT frontal extra-axial space, axial 15/31. Basal cisterns are patent. Moderate calcific atherosclerosis of the carotid siphons.  Moderate RIGHT frontal scalp hematoma with subcutaneous gas, no radiopaque foreign bodies. No skull fracture. The included ocular globes  and orbital contents are non-suspicious. The mastoid aircells and included paranasal sinuses are well-aerated.    CT CERVICAL SPINE FINDINGS  Cervical vertebral bodies and posterior elements are intact and aligned. Straightened cervical lordosis. Moderate to severe C6-7 degenerative disc, moderate at C4-5 and C5-6. No destructive bony lesions. C1-2 articulation maintained with moderate arthropathy. Calcified longus colli insertion. No destructive bony lesions.  Broad-based disc osteophyte complex, mild facet arthropathy without osseous canal stenosis. Severe LEFT C5-6, moderate to severe RIGHT C6-7 neural foraminal narrowing.  IMPRESSION: CT HEAD: 3 mm RIGHT frontal extra-axial density, though this may reflect a cortical vein, in the setting of trauma, subdural hematoma is a concern. No skull fracture.  Moderate RIGHT frontal scalp hematoma, laceration.  CT CERVICAL SPINE: Straightened cervical lordosis without acute fracture or malalignment.  Acute findings discussed with and reconfirmed by Dr.GAIL SCHULZ on 06/23/2014 at 12:00 am.   Electronically Signed   By: Courtnay  Bloomer   On: 06/24/2014 00:00    Review of Systems  Constitutional: Negative for fever and chills.  Respiratory: Negative for shortness of breath.   Cardiovascular: Negative for chest pain and palpitations.  Gastrointestinal: Positive for nausea. Negative for vomiting.  Musculoskeletal: Positive for joint pain and falls.  Neurological: Positive for loss of consciousness. Negative for dizziness.  All other systems reviewed and are negative.  Blood pressure 116/65, pulse 63, temperature 98.3 F (36.8 C), temperature source Oral, resp. rate 16, height 5' 2" (1.575 m), weight 160 lb 1.6 oz (72.621 kg), SpO2 100 %. Physical Exam  Constitutional: She is oriented to person, place, and time. She appears well-developed and well-nourished. No distress.  Neck: Carotid bruit is not present.  Cardiovascular: Normal rate, regular rhythm, normal  heart sounds and intact distal pulses.  Exam reveals no gallop and no friction rub.   No murmur heard. Respiratory: Effort normal and breath sounds normal. No respiratory distress. She has no wheezes. She has no rales.  Musculoskeletal: She exhibits no edema.  Neurological: She is alert and oriented to person, place, and time.  Skin: Skin is warm and dry. She is not diaphoretic.  Psychiatric: She has a normal mood and affect. Her behavior is normal.    Assessment/Plan: Active Problems:   Multiple trauma   Syncope   Asystole- captured 16.6 sec pause on tele  1. Syncope/16 sec Asystole: given today's evidence there is high probability that syncopal event yesterday was secondary to severe bradycardia/asystole. With syncope and 16 sec pause, there is high concern for sinus node dysfunction. No reversible causes have been identified (no AV nodal blocking agents or metabolic derangement). Would recommend PPM insertion today for secondary prevention. Will keep NPO. Dr. Taylor to see. Will keep on Zoll pads in case she has any further recurrence.     SIMMONS, BRITTAINY 06/24/2014, 11:50 AM   EP Attending  Patient seen and examined. I have reviewed the findings of Brittainy Simmons. 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 However, she did not experience much of a prodrome today and was lying down when the episode occurred. She has had problems with needles getting faint and lightheaded in the past. We discussed the treatment options and the risks/benefits/goals/expectations of PPM insertion with the patient and she wishes to proceed.  Gregg Taylor,M.D.   

## 2014-06-24 NOTE — H&P (Signed)
History   [name] is an 69 y.o. female.   Chief Complaint:  Chief Complaint  Patient presents with  . Fall    Fall   she presents after a fall. The last thing she remembers was heading upstairs to bed. She believes she fell going up stairs. She is amnestic of the events. She has no previous history of syncopal episodes. She was brought to the hospital by EMS. She arrived complaining of headache and right wrist pain. While in the emergency department she developed moderate nausea.  Currently, she has minimal headache. She denies neck pain, chest pain, shortness of breath, or abdominal pain. She is otherwise without complaints.  History reviewed. No pertinent past medical history.  History reviewed. No pertinent past surgical history.  Family History  Problem Relation Age of Onset  . Heart disease Father   . Cancer Sister   . Cancer Brother   . Cancer Maternal Grandmother   . Heart disease Maternal Grandfather   . Stroke Paternal Grandmother    Social History:  reports that she has never smoked. She does not have any smokeless tobacco history on file. She reports that she drinks alcohol. Her drug history is not on file.  Allergies  No Known Allergies  Home Medications   Medications Prior to Admission  Medication Sig Dispense Refill  . Ascorbic Acid (VITAMIN C PO) Take by mouth as needed.    . Multiple Vitamin (MULTIVITAMIN) tablet Take 1 tablet by mouth daily.    . Pyridoxine HCl (VITAMIN B-6 PO) Take by mouth.      Trauma Course   Results for orders placed or performed during the hospital encounter of 06/23/14 (from the past 48 hour(s))  I-stat chem 8, ed     Status: Abnormal   Collection Time: 06/23/14 11:21 PM  Result Value Ref Range   Sodium 138 135 - 145 mmol/L   Potassium 3.2 (L) 3.5 - 5.1 mmol/L   Chloride 108 96 - 112 mEq/L   BUN 23 6 - 23 mg/dL   Creatinine, Ser 4.09 0.50 - 1.10 mg/dL   Glucose, Bld 98 70 - 99 mg/dL   Calcium, Ion 8.11 (L) 1.13 - 1.30  mmol/L   TCO2 23 0 - 100 mmol/L   Hemoglobin 13.6 12.0 - 15.0 g/dL   HCT 91.4 78.2 - 95.6 %  I-stat troponin, ED     Status: None   Collection Time: 06/23/14 11:23 PM  Result Value Ref Range   Troponin i, poc 0.00 0.00 - 0.08 ng/mL   Comment 3            Comment: Due to the release kinetics of cTnI, a negative result within the first hours of the onset of symptoms does not rule out myocardial infarction with certainty. If myocardial infarction is still suspected, repeat the test at appropriate intervals.   CBC with Differential     Status: Abnormal   Collection Time: 06/24/14 12:34 AM  Result Value Ref Range   WBC 13.2 (H) 4.0 - 10.5 K/uL    Comment: WHITE COUNT CONFIRMED ON SMEAR   RBC 4.41 3.87 - 5.11 MIL/uL   Hemoglobin 13.5 12.0 - 15.0 g/dL   HCT 21.3 08.6 - 57.8 %   MCV 90.7 78.0 - 100.0 fL   MCH 30.6 26.0 - 34.0 pg   MCHC 33.8 30.0 - 36.0 g/dL   RDW 46.9 62.9 - 52.8 %   Platelets 296 150 - 400 K/uL   Neutrophils Relative % 91 (  H) 43 - 77 %   Lymphocytes Relative 6 (L) 12 - 46 %   Monocytes Relative 3 3 - 12 %   Eosinophils Relative 0 0 - 5 %   Basophils Relative 0 0 - 1 %   Neutro Abs 12.0 (H) 1.7 - 7.7 K/uL   Lymphs Abs 0.8 0.7 - 4.0 K/uL   Monocytes Absolute 0.4 0.1 - 1.0 K/uL   Eosinophils Absolute 0.0 0.0 - 0.7 K/uL   Basophils Absolute 0.0 0.0 - 0.1 K/uL   WBC Morphology      MODERATE LEFT SHIFT (>5% METAS AND MYELOS,OCC PRO NOTED)  Protime-INR     Status: None   Collection Time: 06/24/14 12:34 AM  Result Value Ref Range   Prothrombin Time 12.6 11.6 - 15.2 seconds   INR 0.93 0.00 - 1.49   Dg Wrist Complete Right  06/23/2014   CLINICAL DATA:  Right wrist pain, bruising, deformity. Fall at home. Initial encounter.  EXAM: RIGHT WRIST - COMPLETE 3+ VIEW  COMPARISON:  None.  FINDINGS: Comminuted intra-articular distal radius fracture extending into the distal radial ulnar and radiocarpal joints. There is 1/2 shaft with dorsal displacement of distal fracture  fragments and mild apex volar angulation. There is a mildly displaced ulnar styloid fracture. Associated soft tissue edema is seen about the wrist. Carpal alignment is maintained.  IMPRESSION: Comminuted displaced angulated distal radius fracture extending into the distal radial ulnar and radiocarpal joints. Mildly displaced ulna styloid fracture.   Electronically Signed   By: Rubye Oaks M.D.   On: 06/23/2014 23:55   Ct Head Wo Contrast  06/24/2014   CLINICAL DATA:  Larey Seat down 10 steps at house today. Patient does not recall accident.  EXAM: CT HEAD WITHOUT CONTRAST  CT CERVICAL SPINE WITHOUT CONTRAST  TECHNIQUE: Multidetector CT imaging of the head and cervical spine was performed following the standard protocol without intravenous contrast. Multiplanar CT image reconstructions of the cervical spine were also generated.  COMPARISON:  None.  FINDINGS: CT HEAD FINDINGS  The ventricles and sulci are normal for age. No intraparenchymal hemorrhage, mass effect nor midline shift. Patchy supratentorial white matter hypodensities are within normal range for patient's age and though non-specific suggest sequelae of chronic small vessel ischemic disease. No acute large vascular territory infarcts.  3 mm crescentic density within RIGHT frontal extra-axial space, axial 15/31. Basal cisterns are patent. Moderate calcific atherosclerosis of the carotid siphons.  Moderate RIGHT frontal scalp hematoma with subcutaneous gas, no radiopaque foreign bodies. No skull fracture. The included ocular globes and orbital contents are non-suspicious. The mastoid aircells and included paranasal sinuses are well-aerated.  CT CERVICAL SPINE FINDINGS  Cervical vertebral bodies and posterior elements are intact and aligned. Straightened cervical lordosis. Moderate to severe C6-7 degenerative disc, moderate at C4-5 and C5-6. No destructive bony lesions. C1-2 articulation maintained with moderate arthropathy. Calcified longus colli insertion.  No destructive bony lesions.  Broad-based disc osteophyte complex, mild facet arthropathy without osseous canal stenosis. Severe LEFT C5-6, moderate to severe RIGHT C6-7 neural foraminal narrowing.  IMPRESSION: CT HEAD: 3 mm RIGHT frontal extra-axial density, though this may reflect a cortical vein, in the setting of trauma, subdural hematoma is a concern. No skull fracture.  Moderate RIGHT frontal scalp hematoma, laceration.  CT CERVICAL SPINE: Straightened cervical lordosis without acute fracture or malalignment.  Acute findings discussed with and reconfirmed by [facility] SCHULZ on 06/23/2014 at 12:00 am.   Electronically Signed   By: Awilda Metro   On: 06/24/2014 00:00  Ct Cervical Spine Wo Contrast  06/24/2014   CLINICAL DATA:  Larey SeatFell down 10 steps at house today. Patient does not recall accident.  EXAM: CT HEAD WITHOUT CONTRAST  CT CERVICAL SPINE WITHOUT CONTRAST  TECHNIQUE: Multidetector CT imaging of the head and cervical spine was performed following the standard protocol without intravenous contrast. Multiplanar CT image reconstructions of the cervical spine were also generated.  COMPARISON:  None.  FINDINGS: CT HEAD FINDINGS  The ventricles and sulci are normal for age. No intraparenchymal hemorrhage, mass effect nor midline shift. Patchy supratentorial white matter hypodensities are within normal range for patient's age and though non-specific suggest sequelae of chronic small vessel ischemic disease. No acute large vascular territory infarcts.  3 mm crescentic density within RIGHT frontal extra-axial space, axial 15/31. Basal cisterns are patent. Moderate calcific atherosclerosis of the carotid siphons.  Moderate RIGHT frontal scalp hematoma with subcutaneous gas, no radiopaque foreign bodies. No skull fracture. The included ocular globes and orbital contents are non-suspicious. The mastoid aircells and included paranasal sinuses are well-aerated.  CT CERVICAL SPINE FINDINGS  Cervical vertebral bodies  and posterior elements are intact and aligned. Straightened cervical lordosis. Moderate to severe C6-7 degenerative disc, moderate at C4-5 and C5-6. No destructive bony lesions. C1-2 articulation maintained with moderate arthropathy. Calcified longus colli insertion. No destructive bony lesions.  Broad-based disc osteophyte complex, mild facet arthropathy without osseous canal stenosis. Severe LEFT C5-6, moderate to severe RIGHT C6-7 neural foraminal narrowing.  IMPRESSION: CT HEAD: 3 mm RIGHT frontal extra-axial density, though this may reflect a cortical vein, in the setting of trauma, subdural hematoma is a concern. No skull fracture.  Moderate RIGHT frontal scalp hematoma, laceration.  CT CERVICAL SPINE: Straightened cervical lordosis without acute fracture or malalignment.  Acute findings discussed with and reconfirmed by [facility] on 06/23/2014 at 12:00 am.   Electronically Signed   By: Awilda Metroourtnay  Bloomer   On: 06/24/2014 00:00    Review of Systems  All other systems reviewed and are negative.   Blood pressure 118/61, pulse 91, temperature 97.7 F (36.5 C), temperature source Oral, resp. rate 5, height 5\' 2"  (1.575 m), weight 160 lb 1.6 oz (72.621 kg), SpO2 100 %. Physical Exam  Constitutional: She is oriented to person, place, and time. She appears well-developed and well-nourished. No distress.  HENT:  Head: Normocephalic.  Right Ear: External ear normal.  Left Ear: External ear normal.  Nose: Nose normal.  Mouth/Throat: Oropharynx is clear and moist. No oropharyngeal exudate.  Small laceration above right eye. Small scalp hematoma on the right  Eyes: Conjunctivae are normal. Pupils are equal, round, and reactive to light. Right eye exhibits no discharge. Left eye exhibits no discharge. No scleral icterus.  Neck: Normal range of motion. No tracheal deviation present.  C-spine is nontender  Cardiovascular: Normal rate, regular rhythm, normal heart sounds and intact distal pulses.   No  murmur heard. Respiratory: Effort normal and breath sounds normal. No respiratory distress. She has no wheezes.  GI: Soft. There is no tenderness. There is no guarding.  Musculoskeletal: Normal range of motion. She exhibits edema and tenderness.  Right wrist tenderness and swelling  Neurological: She is alert and oriented to person, place, and time.  GCS is 15  Skin: Skin is warm and dry. No erythema.  Psychiatric: Her behavior is normal. Judgment normal.     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 She may be going to the operating room for repair of her wrist. I will keep her nothing by mouth until she is seen by the hand surgeon. I have not ordered a repeat CAT scan and will let neurosurgery decide if this is necessary. Again, currently she is awake alert and oriented with a GCS of 15. She has no other apparent injuries. Again, she does not believe this was a syncopal episode but a syncope workup may need to be performed.  Zoua Caporaso A 06/24/2014, 6:13 AM   Procedures

## 2014-06-24 NOTE — Progress Notes (Signed)
Central cardiac equipment notification of asystole, pt assessed, no acute findings, pt appears within defined limits. Central cardiac monitoring department called, asystole of 16.6 seconds. MD notified, will continue to follow.  Filed Vitals:   06/24/14 1017  BP: 116/65  Pulse: 63  Temp:   Resp: 16   Errol Ala F, RN

## 2014-06-24 NOTE — Consult Note (Signed)
[name] is an 69 y.o. female.   Chief Complaint: right distal radius fracture HPI: 69 yo rhd female states she fell last night on steps injuring right wrist.  Seen at [facility] where XR revealed right distal radius fracture with dorsal angulation.  Admitted for subdural bleed and cardiac issues.  Pacemaker placed this evening.  Reports no previous injury to right wrist.  History reviewed. No pertinent past medical history.  History reviewed. No pertinent past surgical history.  Family History  Problem Relation Age of Onset  . Heart disease Father   . Cancer Sister   . Cancer Brother   . Cancer Maternal Grandmother   . Heart disease Maternal Grandfather   . Stroke Paternal Grandmother    Social History:  reports that she has never smoked. She does not have any smokeless tobacco history on file. She reports that she drinks alcohol. Her drug history is not on file.  Allergies: No Known Allergies  Medications Prior to Admission  Medication Sig Dispense Refill  . Ascorbic Acid (VITAMIN C PO) Take by mouth as needed.    . Multiple Vitamin (MULTIVITAMIN) tablet Take 1 tablet by mouth daily.    . Pyridoxine HCl (VITAMIN B-6 PO) Take by mouth.      Results for orders placed or performed during the hospital encounter of 06/23/14 (from the past 48 hour(s))  I-stat chem 8, ed     Status: Abnormal   Collection Time: 06/23/14 11:21 PM  Result Value Ref Range   Sodium 138 135 - 145 mmol/L   Potassium 3.2 (L) 3.5 - 5.1 mmol/L   Chloride 108 96 - 112 mEq/L   BUN 23 6 - 23 mg/dL   Creatinine, Ser 1.61 0.50 - 1.10 mg/dL   Glucose, Bld 98 70 - 99 mg/dL   Calcium, Ion 0.96 (L) 1.13 - 1.30 mmol/L   TCO2 23 0 - 100 mmol/L   Hemoglobin 13.6 12.0 - 15.0 g/dL   HCT 04.5 40.9 - 81.1 %  I-stat troponin, ED     Status: None   Collection Time: 06/23/14 11:23 PM  Result Value Ref Range   Troponin i, poc 0.00 0.00 - 0.08 ng/mL   Comment 3            Comment: Due to the release kinetics of cTnI, a  negative result within the first hours of the onset of symptoms does not rule out myocardial infarction with certainty. If myocardial infarction is still suspected, repeat the test at appropriate intervals.   CBC with Differential     Status: Abnormal   Collection Time: 06/24/14 12:34 AM  Result Value Ref Range   WBC 13.2 (H) 4.0 - 10.5 K/uL    Comment: WHITE COUNT CONFIRMED ON SMEAR   RBC 4.41 3.87 - 5.11 MIL/uL   Hemoglobin 13.5 12.0 - 15.0 g/dL   HCT 91.4 78.2 - 95.6 %   MCV 90.7 78.0 - 100.0 fL   MCH 30.6 26.0 - 34.0 pg   MCHC 33.8 30.0 - 36.0 g/dL   RDW 21.3 08.6 - 57.8 %   Platelets 296 150 - 400 K/uL   Neutrophils Relative % 91 (H) 43 - 77 %   Lymphocytes Relative 6 (L) 12 - 46 %   Monocytes Relative 3 3 - 12 %   Eosinophils Relative 0 0 - 5 %   Basophils Relative 0 0 - 1 %   Neutro Abs 12.0 (H) 1.7 - 7.7 K/uL   Lymphs Abs 0.8 0.7 -  4.0 K/uL   Monocytes Absolute 0.4 0.1 - 1.0 K/uL   Eosinophils Absolute 0.0 0.0 - 0.7 K/uL   Basophils Absolute 0.0 0.0 - 0.1 K/uL   WBC Morphology      MODERATE LEFT SHIFT (>5% METAS AND MYELOS,OCC PRO NOTED)  Protime-INR     Status: None   Collection Time: 06/24/14 12:34 AM  Result Value Ref Range   Prothrombin Time 12.6 11.6 - 15.2 seconds   INR 0.93 0.00 - 1.49  Hepatic function panel     Status: Abnormal   Collection Time: 06/24/14 12:34 AM  Result Value Ref Range   Total Protein 6.7 6.0 - 8.3 g/dL   Albumin 4.2 3.5 - 5.2 g/dL   AST 43 (H) 0 - 37 U/L   ALT 15 0 - 35 U/L   Alkaline Phosphatase 68 39 - 117 U/L   Total Bilirubin 1.0 0.3 - 1.2 mg/dL   Bilirubin, Direct 0.5 (H) 0.0 - 0.3 mg/dL   Indirect Bilirubin 0.5 0.3 - 0.9 mg/dL  MRSA PCR Screening     Status: None   Collection Time: 06/24/14  5:00 AM  Result Value Ref Range   MRSA by PCR NEGATIVE NEGATIVE    Comment:        The GeneXpert MRSA Assay (FDA approved for NASAL specimens only), is one component of a comprehensive MRSA colonization surveillance program. It is  not intended to diagnose MRSA infection nor to guide or monitor treatment for MRSA infections.     Dg Wrist Complete Right  06/23/2014   CLINICAL DATA:  Right wrist pain, bruising, deformity. Fall at home. Initial encounter.  EXAM: RIGHT WRIST - COMPLETE 3+ VIEW  COMPARISON:  None.  FINDINGS: Comminuted intra-articular distal radius fracture extending into the distal radial ulnar and radiocarpal joints. There is 1/2 shaft with dorsal displacement of distal fracture fragments and mild apex volar angulation. There is a mildly displaced ulnar styloid fracture. Associated soft tissue edema is seen about the wrist. Carpal alignment is maintained.  IMPRESSION: Comminuted displaced angulated distal radius fracture extending into the distal radial ulnar and radiocarpal joints. Mildly displaced ulna styloid fracture.   Electronically Signed   By: Rubye OaksMelanie  Ehinger M.D.   On: 06/23/2014 23:55   Ct Head Wo Contrast  06/24/2014   CLINICAL DATA:  Larey SeatFell down 10 steps at house today. Patient does not recall accident.  EXAM: CT HEAD WITHOUT CONTRAST  CT CERVICAL SPINE WITHOUT CONTRAST  TECHNIQUE: Multidetector CT imaging of the head and cervical spine was performed following the standard protocol without intravenous contrast. Multiplanar CT image reconstructions of the cervical spine were also generated.  COMPARISON:  None.  FINDINGS: CT HEAD FINDINGS  The ventricles and sulci are normal for age. No intraparenchymal hemorrhage, mass effect nor midline shift. Patchy supratentorial white matter hypodensities are within normal range for patient's age and though non-specific suggest sequelae of chronic small vessel ischemic disease. No acute large vascular territory infarcts.  3 mm crescentic density within RIGHT frontal extra-axial space, axial 15/31. Basal cisterns are patent. Moderate calcific atherosclerosis of the carotid siphons.  Moderate RIGHT frontal scalp hematoma with subcutaneous gas, no radiopaque foreign bodies.  No skull fracture. The included ocular globes and orbital contents are non-suspicious. The mastoid aircells and included paranasal sinuses are well-aerated.  CT CERVICAL SPINE FINDINGS  Cervical vertebral bodies and posterior elements are intact and aligned. Straightened cervical lordosis. Moderate to severe C6-7 degenerative disc, moderate at C4-5 and C5-6. No destructive bony lesions. C1-2 articulation  maintained with moderate arthropathy. Calcified longus colli insertion. No destructive bony lesions.  Broad-based disc osteophyte complex, mild facet arthropathy without osseous canal stenosis. Severe LEFT C5-6, moderate to severe RIGHT C6-7 neural foraminal narrowing.  IMPRESSION: CT HEAD: 3 mm RIGHT frontal extra-axial density, though this may reflect a cortical vein, in the setting of trauma, subdural hematoma is a concern. No skull fracture.  Moderate RIGHT frontal scalp hematoma, laceration.  CT CERVICAL SPINE: Straightened cervical lordosis without acute fracture or malalignment.  Acute findings discussed with and reconfirmed by [facility] SCHULZ on 06/23/2014 at 12:00 am.   Electronically Signed   By: Awilda Metro   On: 06/24/2014 00:00   Ct Cervical Spine Wo Contrast  06/24/2014   CLINICAL DATA:  Larey Seat down 10 steps at house today. Patient does not recall accident.  EXAM: CT HEAD WITHOUT CONTRAST  CT CERVICAL SPINE WITHOUT CONTRAST  TECHNIQUE: Multidetector CT imaging of the head and cervical spine was performed following the standard protocol without intravenous contrast. Multiplanar CT image reconstructions of the cervical spine were also generated.  COMPARISON:  None.  FINDINGS: CT HEAD FINDINGS  The ventricles and sulci are normal for age. No intraparenchymal hemorrhage, mass effect nor midline shift. Patchy supratentorial white matter hypodensities are within normal range for patient's age and though non-specific suggest sequelae of chronic small vessel ischemic disease. No acute large vascular  territory infarcts.  3 mm crescentic density within RIGHT frontal extra-axial space, axial 15/31. Basal cisterns are patent. Moderate calcific atherosclerosis of the carotid siphons.  Moderate RIGHT frontal scalp hematoma with subcutaneous gas, no radiopaque foreign bodies. No skull fracture. The included ocular globes and orbital contents are non-suspicious. The mastoid aircells and included paranasal sinuses are well-aerated.  CT CERVICAL SPINE FINDINGS  Cervical vertebral bodies and posterior elements are intact and aligned. Straightened cervical lordosis. Moderate to severe C6-7 degenerative disc, moderate at C4-5 and C5-6. No destructive bony lesions. C1-2 articulation maintained with moderate arthropathy. Calcified longus colli insertion. No destructive bony lesions.  Broad-based disc osteophyte complex, mild facet arthropathy without osseous canal stenosis. Severe LEFT C5-6, moderate to severe RIGHT C6-7 neural foraminal narrowing.  IMPRESSION: CT HEAD: 3 mm RIGHT frontal extra-axial density, though this may reflect a cortical vein, in the setting of trauma, subdural hematoma is a concern. No skull fracture.  Moderate RIGHT frontal scalp hematoma, laceration.  CT CERVICAL SPINE: Straightened cervical lordosis without acute fracture or malalignment.  Acute findings discussed with and reconfirmed by [facility] SCHULZ on 06/23/2014 at 12:00 am.   Electronically Signed   By: Awilda Metro   On: 06/24/2014 00:00     A comprehensive review of systems was negative.  Blood pressure 128/58, pulse 79, temperature 98.3 F (36.8 C), temperature source Oral, resp. rate 17, height  (1.575 m), weight 72.621 kg (160 lb 1.6 oz), SpO2 100 %.  General appearance: alert, cooperative and appears stated age Head: Normocephalic, without obvious abnormality, atraumatic Neck: supple, symmetrical, trachea midline Extremities: intact sensation and capillary refill all digits.  +epl/fpl/io.  ttp right distal radius.  no  wounds.  compartments soft.  no other ttp. Pulses: 2+ and symmetric Skin: Skin color, texture, turgor normal. No rashes or lesions Neurologic: Grossly normal Incision/Wound: none  Assessment/Plan Right distal radius fracture with dorsal angulation.  Non operative and operative treatment options were discussed with the patient and patient wishes to proceed with operative treatment. Recommend closed reduction with operative fixation as outpatient.  Risks, benefits, and alternatives of reduction were discussed  and the patient agrees with the plan of care.  Procedure note: Hematoma block performed with 10 ml 1% plain xylocaine.  Closed reduction of right distal radius performed and sugar tong splint placed.  Improved clinical position of wrist.  Patient tolerated procedure well.  Post reduction radiographs ordered.  Plan fixation as outpatient.  Follow up in office after discharge.   Ronald Vinsant R 06/24/2014, 9:30 PM

## 2014-06-24 NOTE — Consult Note (Signed)
CC:  Chief Complaint  Patient presents with  . Fall    HPI: [name] is a 69 y.o. female admitted after suffering a fall while walking up the stairs at home.  She says she was caring her laptop, and she thinks she  Tripped on the power cable.  She does not quite remember be exact events of the fall.  She did hit the right side of her head, and was bleeding fairly significantly in addition to having right arm pain, and therefore called EMS and one of her friends and was brought to the emergency department.  CT scan was done which demonstrated a very small right-sided subdural hematoma, and therefore neurosurgical evaluation was requested.  She is currently denying any headaches, visual changes, numbness, tingling, or weakness.  PMH: History reviewed. No pertinent past medical history.  PSH: History reviewed. No pertinent past surgical history.  SH: History  Substance Use Topics  . Smoking status: Never Smoker   . Smokeless tobacco: Not on file  . Alcohol Use: Yes     Comment: WINE    MEDS: Prior to Admission medications   Medication Sig Start Date End Date Taking? Authorizing Provider  Ascorbic Acid (VITAMIN C PO) Take by mouth as needed.    Historical Provider, MD  Multiple Vitamin (MULTIVITAMIN) tablet Take 1 tablet by mouth daily.    Historical Provider, MD  Pyridoxine HCl (VITAMIN B-6 PO) Take by mouth.    Historical Provider, MD    ALLERGY: No Known Allergies  ROS: ROS  NEUROLOGIC EXAM: Awake, alert, oriented Memory and concentration grossly intact Speech fluent, appropriate CN grossly intact Motor exam: Upper Extremities Deltoid Bicep Tricep Grip  Right 5/5 5/5 5/5 5/5  Left 5/5 5/5 5/5 5/5   Lower Extremity IP Quad PF DF EHL  Right 5/5 5/5 5/5 5/5 5/5  Left 5/5 5/5 5/5 5/5 5/5   Sensation grossly intact to LT  [facility] CT of the head was done which demonstrates a very small, approximately 2-3 mm right convexity subdural hematoma.  There is no local  mass effect.  There is no midline shift or hydrocephalus.  CT of the cervical spine was also reviewed which demonstrate straightening of the normal cervical lordosis.  There is no acute fracture or subluxation seen.  IMPRESSION: - 69 y.o. female status post fall with a tiny right convexity subdural hematoma, neurologically at baseline.  PLAN: - given the small size of the hematoma, and normal neurologic exam, the patient does not require any further imaging. - Patient can follow-up in my office in 4-6 weeks and is stable for discharge from a neurosurgical standpoint.

## 2014-06-24 NOTE — H&P (View-Only) (Signed)
Reason for Consult: Syncope/ 16 second aystole Referring Physician: Dr. Excell Seltzer   HPI: The patient is a 69 y/o female with no prior cardiac history and no significant PMH who presented to [facility] 06/23/14 after sustaining a syncopal episode yesterday at home that resulted in head and right upper extremity trauma. She was home alone and the event was unwitnessed. She recalls that she was in her usual state of health and denies any preceding symptoms. She denies palpitations, dizziness, lightheaded, CP, dyspnea and no presyncope. She denies any prior history of syncope. She was walking up her steps to her second floor bedroom when the event occurred. When she regained consciousness, she was bleeding from her head. She called 911 and was transported to [facility].   On arrival, she was noted to have a small subdural hematoma and a right wrist fracture.  Per admission records, she was in SR with stable HR in the 90s. BP was also stable. She was monitored overnight on telemetry. At 10:08 am today, she was observed to have asystole for a duration of 16.8 seconds. During that time she recalls feeling "poorly". She felt nauseated but no vomiting. Symptoms have since resolved. EP has been consulted for consideration for PPM.  She is currently stable and in NSR with HR in the 70s. She is asymptomatic. No further bradycardia/asystole since episode earlier today. She is not on any AV nodal blocking agents. She had mild hypokalemia on admission at 3.2 but not severe enough to cause this degree of bradycardia. External pacer pads are in place.     History reviewed. No pertinent past medical history.  History reviewed. No pertinent past surgical history.  Family History  Problem Relation Age of Onset  . Heart disease Father   . Cancer Sister   . Cancer Brother   . Cancer Maternal Grandmother   . Heart disease Maternal Grandfather   . Stroke Paternal Grandmother     Social History:  reports that she has never  smoked. She does not have any smokeless tobacco history on file. She reports that she drinks alcohol. Her drug history is not on file.  Allergies: No Known Allergies  Medications: Current Facility-Administered Medications  Medication Dose Route Frequency Provider Last Rate Last Dose  . 0.9 % NaCl with KCl 20 mEq/ L  infusion   Intravenous Continuous Abigail Miyamoto, MD 75 mL/hr at 06/24/14 0522    . HYDROmorphone (DILAUDID) injection 1 mg  1 mg Intravenous Q4H PRN Abigail Miyamoto, MD      . morphine 2 MG/ML injection 2 mg  2 mg Intravenous Q1H PRN Abigail Miyamoto, MD      . morphine 4 MG/ML injection 4 mg  4 mg Intravenous Q1H PRN Abigail Miyamoto, MD      . ondansetron [facility]) tablet 4 mg  4 mg Oral Q6H PRN Abigail Miyamoto, MD       Or  . ondansetron [facility]) injection 4 mg  4 mg Intravenous Q6H PRN Abigail Miyamoto, MD         Results for orders placed or performed during the hospital encounter of 06/23/14 (from the past 48 hour(s))  I-stat chem 8, ed     Status: Abnormal   Collection Time: 06/23/14 11:21 PM  Result Value Ref Range   Sodium 138 135 - 145 mmol/L   Potassium 3.2 (L) 3.5 - 5.1 mmol/L   Chloride 108 96 - 112 mEq/L   BUN 23 6 - 23 mg/dL   Creatinine, Ser 1.61 0.50 -  1.10 mg/dL   Glucose, Bld 98 70 - 99 mg/dL   Calcium, Ion 4.09 (L) 1.13 - 1.30 mmol/L   TCO2 23 0 - 100 mmol/L   Hemoglobin 13.6 12.0 - 15.0 g/dL   HCT 81.1 91.4 - 78.2 %  I-stat troponin, ED     Status: None   Collection Time: 06/23/14 11:23 PM  Result Value Ref Range   Troponin i, poc 0.00 0.00 - 0.08 ng/mL   Comment 3            Comment: Due to the release kinetics of cTnI, a negative result within the first hours of the onset of symptoms does not rule out myocardial infarction with certainty. If myocardial infarction is still suspected, repeat the test at appropriate intervals.   CBC with Differential     Status: Abnormal   Collection Time: 06/24/14 12:34 AM  Result Value Ref Range    WBC 13.2 (H) 4.0 - 10.5 K/uL    Comment: WHITE COUNT CONFIRMED ON SMEAR   RBC 4.41 3.87 - 5.11 MIL/uL   Hemoglobin 13.5 12.0 - 15.0 g/dL   HCT 95.6 21.3 - 08.6 %   MCV 90.7 78.0 - 100.0 fL   MCH 30.6 26.0 - 34.0 pg   MCHC 33.8 30.0 - 36.0 g/dL   RDW 57.8 46.9 - 62.9 %   Platelets 296 150 - 400 K/uL   Neutrophils Relative % 91 (H) 43 - 77 %   Lymphocytes Relative 6 (L) 12 - 46 %   Monocytes Relative 3 3 - 12 %   Eosinophils Relative 0 0 - 5 %   Basophils Relative 0 0 - 1 %   Neutro Abs 12.0 (H) 1.7 - 7.7 K/uL   Lymphs Abs 0.8 0.7 - 4.0 K/uL   Monocytes Absolute 0.4 0.1 - 1.0 K/uL   Eosinophils Absolute 0.0 0.0 - 0.7 K/uL   Basophils Absolute 0.0 0.0 - 0.1 K/uL   WBC Morphology      MODERATE LEFT SHIFT (>5% METAS AND MYELOS,OCC PRO NOTED)  Protime-INR     Status: None   Collection Time: 06/24/14 12:34 AM  Result Value Ref Range   Prothrombin Time 12.6 11.6 - 15.2 seconds   INR 0.93 0.00 - 1.49  Hepatic function panel     Status: Abnormal   Collection Time: 06/24/14 12:34 AM  Result Value Ref Range   Total Protein 6.7 6.0 - 8.3 g/dL   Albumin 4.2 3.5 - 5.2 g/dL   AST 43 (H) 0 - 37 U/L   ALT 15 0 - 35 U/L   Alkaline Phosphatase 68 39 - 117 U/L   Total Bilirubin 1.0 0.3 - 1.2 mg/dL   Bilirubin, Direct 0.5 (H) 0.0 - 0.3 mg/dL   Indirect Bilirubin 0.5 0.3 - 0.9 mg/dL  MRSA PCR Screening     Status: None   Collection Time: 06/24/14  5:00 AM  Result Value Ref Range   MRSA by PCR NEGATIVE NEGATIVE    Comment:        The GeneXpert MRSA Assay (FDA approved for NASAL specimens only), is one component of a comprehensive MRSA colonization surveillance program. It is not intended to diagnose MRSA infection nor to guide or monitor treatment for MRSA infections.     Dg Wrist Complete Right  06/23/2014   CLINICAL DATA:  Right wrist pain, bruising, deformity. Fall at home. Initial encounter.  EXAM: RIGHT WRIST - COMPLETE 3+ VIEW  COMPARISON:  None.  FINDINGS: Comminuted  intra-articular distal  radius fracture extending into the distal radial ulnar and radiocarpal joints. There is 1/2 shaft with dorsal displacement of distal fracture fragments and mild apex volar angulation. There is a mildly displaced ulnar styloid fracture. Associated soft tissue edema is seen about the wrist. Carpal alignment is maintained.  IMPRESSION: Comminuted displaced angulated distal radius fracture extending into the distal radial ulnar and radiocarpal joints. Mildly displaced ulna styloid fracture.   Electronically Signed   By: Rubye OaksMelanie  Ehinger M.D.   On: 06/23/2014 23:55   Ct Head Wo Contrast  06/24/2014   CLINICAL DATA:  Larey SeatFell down 10 steps at house today. Patient does not recall accident.  EXAM: CT HEAD WITHOUT CONTRAST  CT CERVICAL SPINE WITHOUT CONTRAST  TECHNIQUE: Multidetector CT imaging of the head and cervical spine was performed following the standard protocol without intravenous contrast. Multiplanar CT image reconstructions of the cervical spine were also generated.  COMPARISON:  None.  FINDINGS: CT HEAD FINDINGS  The ventricles and sulci are normal for age. No intraparenchymal hemorrhage, mass effect nor midline shift. Patchy supratentorial white matter hypodensities are within normal range for patient's age and though non-specific suggest sequelae of chronic small vessel ischemic disease. No acute large vascular territory infarcts.  3 mm crescentic density within RIGHT frontal extra-axial space, axial 15/31. Basal cisterns are patent. Moderate calcific atherosclerosis of the carotid siphons.  Moderate RIGHT frontal scalp hematoma with subcutaneous gas, no radiopaque foreign bodies. No skull fracture. The included ocular globes and orbital contents are non-suspicious. The mastoid aircells and included paranasal sinuses are well-aerated.  CT CERVICAL SPINE FINDINGS  Cervical vertebral bodies and posterior elements are intact and aligned. Straightened cervical lordosis. Moderate to severe C6-7  degenerative disc, moderate at C4-5 and C5-6. No destructive bony lesions. C1-2 articulation maintained with moderate arthropathy. Calcified longus colli insertion. No destructive bony lesions.  Broad-based disc osteophyte complex, mild facet arthropathy without osseous canal stenosis. Severe LEFT C5-6, moderate to severe RIGHT C6-7 neural foraminal narrowing.  IMPRESSION: CT HEAD: 3 mm RIGHT frontal extra-axial density, though this may reflect a cortical vein, in the setting of trauma, subdural hematoma is a concern. No skull fracture.  Moderate RIGHT frontal scalp hematoma, laceration.  CT CERVICAL SPINE: Straightened cervical lordosis without acute fracture or malalignment.  Acute findings discussed with and reconfirmed by [facility] SCHULZ on 06/23/2014 at 12:00 am.   Electronically Signed   By: Awilda Metroourtnay  Bloomer   On: 06/24/2014 00:00   Ct Cervical Spine Wo Contrast  06/24/2014   CLINICAL DATA:  Larey SeatFell down 10 steps at house today. Patient does not recall accident.  EXAM: CT HEAD WITHOUT CONTRAST  CT CERVICAL SPINE WITHOUT CONTRAST  TECHNIQUE: Multidetector CT imaging of the head and cervical spine was performed following the standard protocol without intravenous contrast. Multiplanar CT image reconstructions of the cervical spine were also generated.  COMPARISON:  None.  FINDINGS: CT HEAD FINDINGS  The ventricles and sulci are normal for age. No intraparenchymal hemorrhage, mass effect nor midline shift. Patchy supratentorial white matter hypodensities are within normal range for patient's age and though non-specific suggest sequelae of chronic small vessel ischemic disease. No acute large vascular territory infarcts.  3 mm crescentic density within RIGHT frontal extra-axial space, axial 15/31. Basal cisterns are patent. Moderate calcific atherosclerosis of the carotid siphons.  Moderate RIGHT frontal scalp hematoma with subcutaneous gas, no radiopaque foreign bodies. No skull fracture. The included ocular globes  and orbital contents are non-suspicious. The mastoid aircells and included paranasal sinuses are well-aerated.  CT CERVICAL SPINE FINDINGS  Cervical vertebral bodies and posterior elements are intact and aligned. Straightened cervical lordosis. Moderate to severe C6-7 degenerative disc, moderate at C4-5 and C5-6. No destructive bony lesions. C1-2 articulation maintained with moderate arthropathy. Calcified longus colli insertion. No destructive bony lesions.  Broad-based disc osteophyte complex, mild facet arthropathy without osseous canal stenosis. Severe LEFT C5-6, moderate to severe RIGHT C6-7 neural foraminal narrowing.  IMPRESSION: CT HEAD: 3 mm RIGHT frontal extra-axial density, though this may reflect a cortical vein, in the setting of trauma, subdural hematoma is a concern. No skull fracture.  Moderate RIGHT frontal scalp hematoma, laceration.  CT CERVICAL SPINE: Straightened cervical lordosis without acute fracture or malalignment.  Acute findings discussed with and reconfirmed by [facility] SCHULZ on 06/23/2014 at 12:00 am.   Electronically Signed   By: Awilda Metro   On: 06/24/2014 00:00    Review of Systems  Constitutional: Negative for fever and chills.  Respiratory: Negative for shortness of breath.   Cardiovascular: Negative for chest pain and palpitations.  Gastrointestinal: Positive for nausea. Negative for vomiting.  Musculoskeletal: Positive for joint pain and falls.  Neurological: Positive for loss of consciousness. Negative for dizziness.  All other systems reviewed and are negative.  Blood pressure 116/65, pulse 63, temperature 98.3 F (36.8 C), temperature source Oral, resp. rate 16, height 5\' 2"  (1.575 m), weight 160 lb 1.6 oz (72.621 kg), SpO2 100 %. Physical Exam  Constitutional: She is oriented to person, place, and time. She appears well-developed and well-nourished. No distress.  Neck: Carotid bruit is not present.  Cardiovascular: Normal rate, regular rhythm, normal  heart sounds and intact distal pulses.  Exam reveals no gallop and no friction rub.   No murmur heard. Respiratory: Effort normal and breath sounds normal. No respiratory distress. She has no wheezes. She has no rales.  Musculoskeletal: She exhibits no edema.  Neurological: She is alert and oriented to person, place, and time.  Skin: Skin is warm and dry. She is not diaphoretic.  Psychiatric: She has a normal mood and affect. Her behavior is normal.    Assessment/Plan: Active Problems:   Multiple trauma   Syncope   Asystole- captured 16.6 sec pause on tele  1. Syncope/16 sec Asystole: given today's evidence there is high probability that syncopal event yesterday was secondary to severe bradycardia/asystole. With syncope and 16 sec pause, there is high concern for sinus node dysfunction. No reversible causes have been identified (no AV nodal blocking agents or metabolic derangement). Would recommend PPM insertion today for secondary prevention. Will keep NPO. Dr. Ladona Ridgel to see. Will keep on Zoll pads in case she has any further recurrence.     SIMMONS, BRITTAINY 06/24/2014, 11:50 AM   EP Attending  Patient seen and examined. I have reviewed the findings of Brittainy Simmons. 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 However, she did not experience much of a prodrome today and was lying down when the episode occurred. She has had problems with needles getting faint and lightheaded in the past. We discussed the treatment options and the risks/benefits/goals/expectations of PPM insertion with the patient and she wishes to proceed.  Leonia Reeves.D.

## 2014-06-24 NOTE — Interval H&P Note (Signed)
History and Physical Interval Note:  06/24/2014 6:27 PM  Mary FinnerPatricia Day  has presented today for surgery, with the diagnosis of pause  The various methods of treatment have been discussed with the patient and family. After consideration of risks, benefits and other options for treatment, the patient has consented to  Procedure(s): PERMANENT PACEMAKER INSERTION (N/A) as a surgical intervention .  The patient's history has been reviewed, patient examined, no change in status, stable for surgery.  I have reviewed the patient's chart and labs.  Questions were answered to the patient's satisfaction.     Lewayne BuntingGregg Willy Vorce

## 2014-06-24 NOTE — Consult Note (Signed)
CARDIOLOGY CONSULT NOTE  Patient ID: [name], MRN: [id number], DOB/AGE: Dec 19, 1945 69 y.o. Admit date: 06/23/2014 Date of Consult: 06/24/2014  Primary Physician: No primary care provider on file. Primary Cardiologist: none Referring Physician: Dr Lindie Spruce  Chief Complaint: Syncope with long pause (16 second asystole)  Reason for Consultation: Same as above  HPI: This is a 69 year old healthy woman with no past cardiac history who presented after a fall yesterday. The patient was walking up her stairs and she suddenly fell down the steps. She sustained a head injury with small subdural hematoma. She also sustained a right wrist fracture and surgery is planned. The patient remembers walking up the stairs, she then had a fall but is amnestic of the details. She remembers waking up and looking in the mere to see blood on her face. She has no past history of syncope, dizziness, or lightheadedness. She has had no chest pain, chest pressure, or shortness of breath. She is otherwise physically active and has been in her normal state of health. She has never been hospitalized. She has no past surgical history. She specifically denies any history of hypertension, hyperlipidemia, or diabetes. She has no medical problems.  This morning, she had some mild nausea. Around the same time, she developed marked bradycardia then had a 16 second sinus pause. She felt weak and lethargic with this, but denies loss of consciousness. At the time of my evaluation, she is awake and alert in sinus rhythm.  Medical History: History reviewed. No pertinent past medical history.    Surgical History: History reviewed. No pertinent past surgical history.   Home Meds: Prior to Admission medications   Medication Sig Start Date End Date Taking? Authorizing Provider  Ascorbic Acid (VITAMIN C PO) Take by mouth as needed.    Historical Provider, MD  Multiple Vitamin (MULTIVITAMIN) tablet Take 1 tablet by mouth daily.     Historical Provider, MD  Pyridoxine HCl (VITAMIN B-6 PO) Take by mouth.    Historical Provider, MD    Inpatient Medications:  . sodium chloride  250 mL Intravenous Once   . 0.9 % NaCl with KCl 20 mEq / L 75 mL/hr at 06/24/14 0522    Allergies: No Known Allergies  History   Social History  . Marital Status: Single    Spouse Name: N/A    Number of Children: N/A  . Years of Education: N/A   Occupational History  . Not on file.   Social History Main Topics  . Smoking status: Never Smoker   . Smokeless tobacco: Not on file  . Alcohol Use: Yes     Comment: WINE  . Drug Use: Not on file  . Sexual Activity: Not on file   Other Topics Concern  . Not on file   Social History Narrative     Family History  Problem Relation Age of Onset  . Heart disease Father   . Cancer Sister   . Cancer Brother   . Cancer Maternal Grandmother   . Heart disease Maternal Grandfather   . Stroke Paternal Grandmother      Review of Systems: General: negative for chills, fever, night sweats or weight changes.  ENT: negative for rhinorrhea or epistaxis Cardiovascular: negative for chest pain, shortness of breath, dyspnea on exertion, edema, orthopnea, palpitations, or paroxysmal nocturnal dyspnea Dermatological: negative for rash Respiratory: negative for cough or wheezing GI: negative for nausea, vomiting, diarrhea, bright red blood per rectum, melena, or hematemesis GU: no hematuria, urgency, or frequency  Neurologic: negative for visual changes, headache, or dizziness Heme: no easy bruising or bleeding Endo: negative for excessive thirst, thyroid disorder, or flushing Musculoskeletal: positive for right wrist pain  All other systems reviewed and are otherwise negative except as noted above.  Physical Exam: Blood pressure 116/65, pulse 63, temperature 98.3 F (36.8 C), temperature source Oral, resp. rate 16, height  (1.575 m), weight 160 lb 1.6 oz (72.621 kg), SpO2 100 %. Pt is  alert and oriented, WD, WN, in no distress. HEENT: normal Neck: JVP normal. Carotid upstrokes normal without bruits. No thyromegaly. Lungs: equal expansion, clear bilaterally CV: Apex is discrete and nondisplaced, RRR without murmur or gallop Abd: soft, NT, +BS, no bruit, no hepatosplenomegaly Back: no CVA tenderness Ext: no C/C/E        DP/PT pulses intact and = Skin: no rash, somewhat pale and diaphoretic Neuro: CNII-XII intact             Strength intact = bilaterally    Labs: No results for input(s): CKTOTAL, CKMB, TROPONINI in the last 72 hours. Lab Results  Component Value Date   WBC 13.2* 06/24/2014   HGB 13.5 06/24/2014   HCT 40.0 06/24/2014   MCV 90.7 06/24/2014   PLT 296 06/24/2014    Recent Labs Lab 06/23/14 2321 06/24/14 0034  NA 138  --   K 3.2*  --   CL 108  --   BUN 23  --   CREATININE 0.80  --   PROT  --  6.7  BILITOT  --  1.0  ALKPHOS  --  68  ALT  --  15  AST  --  43*  GLUCOSE 98  --    No results found for: CHOL, HDL, LDLCALC, TRIG No results found for: DDIMER  Radiology/Studies:  Dg Wrist Complete Right  06/23/2014   CLINICAL DATA:  Right wrist pain, bruising, deformity. Fall at home. Initial encounter.  EXAM: RIGHT WRIST - COMPLETE 3+ VIEW  COMPARISON:  None.  FINDINGS: Comminuted intra-articular distal radius fracture extending into the distal radial ulnar and radiocarpal joints. There is 1/2 shaft with dorsal displacement of distal fracture fragments and mild apex volar angulation. There is a mildly displaced ulnar styloid fracture. Associated soft tissue edema is seen about the wrist. Carpal alignment is maintained.  IMPRESSION: Comminuted displaced angulated distal radius fracture extending into the distal radial ulnar and radiocarpal joints. Mildly displaced ulna styloid fracture.   Electronically Signed   By: Rubye Oaks M.D.   On: 06/23/2014 23:55   Ct Head Wo Contrast  06/24/2014   CLINICAL DATA:  Larey Seat down 10 steps at house today.  Patient does not recall accident.  EXAM: CT HEAD WITHOUT CONTRAST  CT CERVICAL SPINE WITHOUT CONTRAST  TECHNIQUE: Multidetector CT imaging of the head and cervical spine was performed following the standard protocol without intravenous contrast. Multiplanar CT image reconstructions of the cervical spine were also generated.  COMPARISON:  None.  FINDINGS: CT HEAD FINDINGS  The ventricles and sulci are normal for age. No intraparenchymal hemorrhage, mass effect nor midline shift. Patchy supratentorial white matter hypodensities are within normal range for patient's age and though non-specific suggest sequelae of chronic small vessel ischemic disease. No acute large vascular territory infarcts.  3 mm crescentic density within RIGHT frontal extra-axial space, axial 15/31. Basal cisterns are patent. Moderate calcific atherosclerosis of the carotid siphons.  Moderate RIGHT frontal scalp hematoma with subcutaneous gas, no radiopaque foreign bodies. No skull fracture. The included ocular globes  and orbital contents are non-suspicious. The mastoid aircells and included paranasal sinuses are well-aerated.  CT CERVICAL SPINE FINDINGS  Cervical vertebral bodies and posterior elements are intact and aligned. Straightened cervical lordosis. Moderate to severe C6-7 degenerative disc, moderate at C4-5 and C5-6. No destructive bony lesions. C1-2 articulation maintained with moderate arthropathy. Calcified longus colli insertion. No destructive bony lesions.  Broad-based disc osteophyte complex, mild facet arthropathy without osseous canal stenosis. Severe LEFT C5-6, moderate to severe RIGHT C6-7 neural foraminal narrowing.  IMPRESSION: CT HEAD: 3 mm RIGHT frontal extra-axial density, though this may reflect a cortical vein, in the setting of trauma, subdural hematoma is a concern. No skull fracture.  Moderate RIGHT frontal scalp hematoma, laceration.  CT CERVICAL SPINE: Straightened cervical lordosis without acute fracture or  malalignment.  Acute findings discussed with and reconfirmed by [facility] SCHULZ on 06/23/2014 at 12:00 am.   Electronically Signed   By: Awilda Metro   On: 06/24/2014 00:00   Ct Cervical Spine Wo Contrast  06/24/2014   CLINICAL DATA:  Larey Seat down 10 steps at house today. Patient does not recall accident.  EXAM: CT HEAD WITHOUT CONTRAST  CT CERVICAL SPINE WITHOUT CONTRAST  TECHNIQUE: Multidetector CT imaging of the head and cervical spine was performed following the standard protocol without intravenous contrast. Multiplanar CT image reconstructions of the cervical spine were also generated.  COMPARISON:  None.  FINDINGS: CT HEAD FINDINGS  The ventricles and sulci are normal for age. No intraparenchymal hemorrhage, mass effect nor midline shift. Patchy supratentorial white matter hypodensities are within normal range for patient's age and though non-specific suggest sequelae of chronic small vessel ischemic disease. No acute large vascular territory infarcts.  3 mm crescentic density within RIGHT frontal extra-axial space, axial 15/31. Basal cisterns are patent. Moderate calcific atherosclerosis of the carotid siphons.  Moderate RIGHT frontal scalp hematoma with subcutaneous gas, no radiopaque foreign bodies. No skull fracture. The included ocular globes and orbital contents are non-suspicious. The mastoid aircells and included paranasal sinuses are well-aerated.  CT CERVICAL SPINE FINDINGS  Cervical vertebral bodies and posterior elements are intact and aligned. Straightened cervical lordosis. Moderate to severe C6-7 degenerative disc, moderate at C4-5 and C5-6. No destructive bony lesions. C1-2 articulation maintained with moderate arthropathy. Calcified longus colli insertion. No destructive bony lesions.  Broad-based disc osteophyte complex, mild facet arthropathy without osseous canal stenosis. Severe LEFT C5-6, moderate to severe RIGHT C6-7 neural foraminal narrowing.  IMPRESSION: CT HEAD: 3 mm RIGHT frontal  extra-axial density, though this may reflect a cortical vein, in the setting of trauma, subdural hematoma is a concern. No skull fracture.  Moderate RIGHT frontal scalp hematoma, laceration.  CT CERVICAL SPINE: Straightened cervical lordosis without acute fracture or malalignment.  Acute findings discussed with and reconfirmed by [facility] SCHULZ on 06/23/2014 at 12:00 am.   Electronically Signed   By: Awilda Metro   On: 06/24/2014 00:00    EKG: NSR with first degree AV block, otherwise within normal limts  Cardiac Studies: Tele: 09:58 today - Marked sinus brady followed by sinus arrest with 16 second pause  ASSESSMENT AND PLAN:  Sinus arrest with 16 second asystole: With unexplained fall/syncope yesterday and second event today, suspect she will need permanent pacemaker. No other hx of medical problems, no metabolic derangement, and no AV nodal blocking agents on board. Without another clear explanation of events, suspect sinus node dysfunction with syncope and will likely require PPM. Will ask for formal EP evaluation and consideration of PPM implantation. Heart  rhythm at time of my evaluation is sinus rhythm with heart rate approximately 70 bpm. Will give her a 250 cc 0.9% saline bolus and place Zoll pads in case another brady/asystole event occurs. Keep NPO for possible pacemaker today.  Signed, Tonny BollmanMichael Brinkley Peet MD, [facility] 06/24/2014, 10:50 AM

## 2014-06-25 ENCOUNTER — Inpatient Hospital Stay (HOSPITAL_COMMUNITY): Payer: Medicare Other

## 2014-06-25 DIAGNOSIS — Y92009 Unspecified place in unspecified non-institutional (private) residence as the place of occurrence of the external cause: Secondary | ICD-10-CM

## 2014-06-25 DIAGNOSIS — I459 Conduction disorder, unspecified: Secondary | ICD-10-CM

## 2014-06-25 DIAGNOSIS — W19XXXA Unspecified fall, initial encounter: Secondary | ICD-10-CM

## 2014-06-25 DIAGNOSIS — S065X9A Traumatic subdural hemorrhage with loss of consciousness of unspecified duration, initial encounter: Secondary | ICD-10-CM | POA: Diagnosis present

## 2014-06-25 DIAGNOSIS — S62109A Fracture of unspecified carpal bone, unspecified wrist, initial encounter for closed fracture: Secondary | ICD-10-CM | POA: Diagnosis present

## 2014-06-25 DIAGNOSIS — I495 Sick sinus syndrome: Secondary | ICD-10-CM

## 2014-06-25 DIAGNOSIS — S065XAA Traumatic subdural hemorrhage with loss of consciousness status unknown, initial encounter: Secondary | ICD-10-CM | POA: Diagnosis present

## 2014-06-25 MED ORDER — TRAMADOL HCL 50 MG PO TABS: 50.0000 mg | ORAL_TABLET | Freq: Four times a day (QID) | ORAL | Status: DC | PRN

## 2014-06-25 NOTE — Progress Notes (Signed)
  Patient seen by Dr. Ladona Ridgelaylor earlier today. She is stable from a cardiac standpoint for discharge. Full Progress Note from Dr. Ladona Ridgelaylor to follow. F/U appointment for wound check has been arranged . Date and time will appear on discharge instructions. Wound care instructions also placed in epic.   SIMMONS, BRITTAINY 06/25/2014

## 2014-06-25 NOTE — Progress Notes (Signed)
Patient ID: [name], female   DOB: 19-Jul-1945, 69 y.o.   MRN: [id number]   LOS: 2 days   Subjective: No c/o, ready to go home.   Objective: Vital signs in last 24 hours: Temp:  [98.3 F (36.8 C)-98.8 F (37.1 C)] 98.5 F (36.9 C) (01/07 0402) Pulse Rate:  [58-87] 72 (01/07 0402) Resp:  [0-23] 12 (01/07 0402) BP: (112-128)/(57-69) 112/57 mmHg (01/07 0402) SpO2:  [98 %-100 %] 99 % (01/07 0402) Last BM Date: 06/23/14   Radiology Results CHEST 2 VIEW  COMPARISON: None.  FINDINGS: Left subclavian dual lead pacemaker in satisfactory position. No pneumothorax.  Heart size normal. Negative for heart failure. Lungs are clear.  IMPRESSION: Satisfactory transvenous pacemaker placement. No active cardiopulmonary abnormality.   Electronically Signed  By: Marlan Palauharles Clark M.D.  On: 06/25/2014 07:38   Physical Exam General appearance: alert and no distress Resp: clear to auscultation bilaterally Cardio: regular rate and rhythm GI: normal findings: bowel sounds normal and soft, non-tender Extremities: NVI Neuro: A&A   Assessment/Plan: Fall 2/2 syncope TBI w/SDH -- No further imaging as long as neurologically intact Right wrist fx s/p CR -- Dr. Merlyn LotKuzma planning ORIF as OP Sinus node dysfunction s/p pacemaker Dispo -- Doing exceptionally well. All services have cleared so will d/c home.    Freeman CaldronMichael J. Yomar Mejorado, PA-C Pager: [phone] General Trauma PA Pager: [phone]  06/25/2014

## 2014-06-25 NOTE — Progress Notes (Signed)
Patient ID: Mary Finneratricia Mcelreath, female   DOB: 07-18-1945, 69 y.o.   MRN: [id number]    Patient Name: [name] Date of Encounter: 06/25/2014     Active Problems:   Multiple trauma   Syncope   Asystole- captured 16.6 sec pause on tele   Sinus node dysfunction    SUBJECTIVE  No chest pain or sob. Wrist pain is controlled.   CURRENT MEDS . antiseptic oral rinse  7 mL Mouth Rinse BID  .  ceFAZolin (ANCEF) IV  1 g Intravenous Q6H    OBJECTIVE  Filed Vitals:   06/24/14 1547 06/24/14 1958 06/24/14 2349 06/25/14 0402  BP: 125/69 128/58 121/64 112/57  Pulse: 76 79 87 72  Temp: 98.8 F (37.1 C) 98.3 F (36.8 C) 98.7 F (37.1 C) 98.5 F (36.9 C)  TempSrc: Oral Oral Oral Oral  Resp: 23 17 14 12   Height:      Weight:      SpO2: 99% 100% 99% 99%    Intake/Output Summary (Last 24 hours) at 06/25/14 0839 Last data filed at 06/25/14 0700  Gross per 24 hour  Intake   1675 ml  Output    450 ml  Net   1225 ml   Filed Weights   06/24/14 0404  Weight: 160 lb 1.6 oz (72.621 kg)    PHYSICAL EXAM  General: Pleasant, NAD. Neuro: Alert and oriented X 3. Moves all extremities spontaneously. Psych: Normal affect. HEENT:  Normal  Neck: Supple without bruits or JVD. Lungs:  Resp regular and unlabored, CTA. Heart: RRR no s3, s4, or murmurs. Abdomen: Soft, non-tender, non-distended, BS + x 4.  Extremities: No clubbing, cyanosis or edema. DP/PT/Radials 2+ and equal bilaterally.  Accessory Clinical Findings  CBC  Recent Labs  06/23/14 2321 06/24/14 0034  WBC  --  13.2*  NEUTROABS  --  12.0*  HGB 13.6 13.5  HCT 40.0 40.0  MCV  --  90.7  PLT  --  296   Basic Metabolic Panel  Recent Labs  06/23/14 2321  NA 138  K 3.2*  CL 108  GLUCOSE 98  BUN 23  CREATININE 0.80   Liver Function Tests  Recent Labs  06/24/14 0034  AST 43*  ALT 15  ALKPHOS 68  BILITOT 1.0  PROT 6.7  ALBUMIN 4.2   No results for input(s): LIPASE, AMYLASE in the last 72 hours. Cardiac  Enzymes No results for input(s): CKTOTAL, CKMB, CKMBINDEX, TROPONINI in the last 72 hours. BNP Invalid input(s): POCBNP D-Dimer No results for input(s): DDIMER in the last 72 hours. Hemoglobin A1C No results for input(s): HGBA1C in the last 72 hours. Fasting Lipid Panel No results for input(s): CHOL, HDL, LDLCALC, TRIG, CHOLHDL, LDLDIRECT in the last 72 hours. Thyroid Function Tests No results for input(s): TSH, T4TOTAL, T3FREE, THYROIDAB in the last 72 hours.  Invalid input(s): FREET3  TELE  nsr  Radiology/Studies  Dg Chest 2 View  06/25/2014   CLINICAL DATA:  Pacemaker  EXAM: CHEST  2 VIEW  COMPARISON:  None.  FINDINGS: Left subclavian dual lead pacemaker in satisfactory position. No pneumothorax.  Heart size normal.  Negative for heart failure.  Lungs are clear.  IMPRESSION: Satisfactory transvenous pacemaker placement. No active cardiopulmonary abnormality.   Electronically Signed   By: Marlan Palauharles  Clark M.D.   On: 06/25/2014 07:38   Dg Wrist 2 Views Right  06/24/2014   CLINICAL DATA:  Postreduction for distal right radial fracture.  EXAM: RIGHT WRIST - 2 VIEW  COMPARISON:  06/23/2014  FINDINGS: Postreduction and cast due to views of the right wrist demonstrate improved angulation and position of the previously identified distal radial and ulnar fracture fragments. Overlying cast material obscures bone detail.  IMPRESSION: Improved angulation and position of distal right radial fractures post reduction and casting.   Electronically Signed   By: Burman Nieves M.D.   On: 06/24/2014 23:56   Dg Wrist Complete Right  06/23/2014   CLINICAL DATA:  Right wrist pain, bruising, deformity. Fall at home. Initial encounter.  EXAM: RIGHT WRIST - COMPLETE 3+ VIEW  COMPARISON:  None.  FINDINGS: Comminuted intra-articular distal radius fracture extending into the distal radial ulnar and radiocarpal joints. There is 1/2 shaft with dorsal displacement of distal fracture fragments and mild apex volar  angulation. There is a mildly displaced ulnar styloid fracture. Associated soft tissue edema is seen about the wrist. Carpal alignment is maintained.  IMPRESSION: Comminuted displaced angulated distal radius fracture extending into the distal radial ulnar and radiocarpal joints. Mildly displaced ulna styloid fracture.   Electronically Signed   By: Rubye Oaks M.D.   On: 06/23/2014 23:55   Ct Head Wo Contrast  06/24/2014   CLINICAL DATA:  Larey Seat down 10 steps at house today. Patient does not recall accident.  EXAM: CT HEAD WITHOUT CONTRAST  CT CERVICAL SPINE WITHOUT CONTRAST  TECHNIQUE: Multidetector CT imaging of the head and cervical spine was performed following the standard protocol without intravenous contrast. Multiplanar CT image reconstructions of the cervical spine were also generated.  COMPARISON:  None.  FINDINGS: CT HEAD FINDINGS  The ventricles and sulci are normal for age. No intraparenchymal hemorrhage, mass effect nor midline shift. Patchy supratentorial white matter hypodensities are within normal range for patient's age and though non-specific suggest sequelae of chronic small vessel ischemic disease. No acute large vascular territory infarcts.  3 mm crescentic density within RIGHT frontal extra-axial space, axial 15/31. Basal cisterns are patent. Moderate calcific atherosclerosis of the carotid siphons.  Moderate RIGHT frontal scalp hematoma with subcutaneous gas, no radiopaque foreign bodies. No skull fracture. The included ocular globes and orbital contents are non-suspicious. The mastoid aircells and included paranasal sinuses are well-aerated.  CT CERVICAL SPINE FINDINGS  Cervical vertebral bodies and posterior elements are intact and aligned. Straightened cervical lordosis. Moderate to severe C6-7 degenerative disc, moderate at C4-5 and C5-6. No destructive bony lesions. C1-2 articulation maintained with moderate arthropathy. Calcified longus colli insertion. No destructive bony lesions.   Broad-based disc osteophyte complex, mild facet arthropathy without osseous canal stenosis. Severe LEFT C5-6, moderate to severe RIGHT C6-7 neural foraminal narrowing.  IMPRESSION: CT HEAD: 3 mm RIGHT frontal extra-axial density, though this may reflect a cortical vein, in the setting of trauma, subdural hematoma is a concern. No skull fracture.  Moderate RIGHT frontal scalp hematoma, laceration.  CT CERVICAL SPINE: Straightened cervical lordosis without acute fracture or malalignment.  Acute findings discussed with and reconfirmed by [facility] SCHULZ on 06/23/2014 at 12:00 am.   Electronically Signed   By: Awilda Metro   On: 06/24/2014 00:00   Ct Cervical Spine Wo Contrast  06/24/2014   CLINICAL DATA:  Larey Seat down 10 steps at house today. Patient does not recall accident.  EXAM: CT HEAD WITHOUT CONTRAST  CT CERVICAL SPINE WITHOUT CONTRAST  TECHNIQUE: Multidetector CT imaging of the head and cervical spine was performed following the standard protocol without intravenous contrast. Multiplanar CT image reconstructions of the cervical spine were also generated.  COMPARISON:  None.  FINDINGS: CT HEAD  FINDINGS  The ventricles and sulci are normal for age. No intraparenchymal hemorrhage, mass effect nor midline shift. Patchy supratentorial white matter hypodensities are within normal range for patient's age and though non-specific suggest sequelae of chronic small vessel ischemic disease. No acute large vascular territory infarcts.  3 mm crescentic density within RIGHT frontal extra-axial space, axial 15/31. Basal cisterns are patent. Moderate calcific atherosclerosis of the carotid siphons.  Moderate RIGHT frontal scalp hematoma with subcutaneous gas, no radiopaque foreign bodies. No skull fracture. The included ocular globes and orbital contents are non-suspicious. The mastoid aircells and included paranasal sinuses are well-aerated.  CT CERVICAL SPINE FINDINGS  Cervical vertebral bodies and posterior elements are  intact and aligned. Straightened cervical lordosis. Moderate to severe C6-7 degenerative disc, moderate at C4-5 and C5-6. No destructive bony lesions. C1-2 articulation maintained with moderate arthropathy. Calcified longus colli insertion. No destructive bony lesions.  Broad-based disc osteophyte complex, mild facet arthropathy without osseous canal stenosis. Severe LEFT C5-6, moderate to severe RIGHT C6-7 neural foraminal narrowing.  IMPRESSION: CT HEAD: 3 mm RIGHT frontal extra-axial density, though this may reflect a cortical vein, in the setting of trauma, subdural hematoma is a concern. No skull fracture.  Moderate RIGHT frontal scalp hematoma, laceration.  CT CERVICAL SPINE: Straightened cervical lordosis without acute fracture or malalignment.  Acute findings discussed with and reconfirmed by [facility] SCHULZ on 06/23/2014 at 12:00 am.   Electronically Signed   By: Awilda Metro   On: 06/24/2014 00:00    ASSESSMENT AND PLAN 1. Symptomatic bradycardia due to extrinsic sinus node dysfunction 2. Syncope due to #1 3. Wrist fracture 4. Small SDH 5. S/p PPM working normally.  Rec: ok for discharge home from EP perspective. We will schedule her PPM followup. Instructions regarding limitations of activity given to patient. She should not drive for at least 3 months.  Eily Louvier,M.D.  06/25/2014 8:39 AM

## 2014-06-25 NOTE — Discharge Summary (Signed)
Physician Discharge Summary  Patient ID: Jonnie Finneratricia Volland MRN: [id number] DOB/AGE: 07-01-1945 69 y.o.  Admit date: 06/23/2014 Discharge date: 06/25/2014  Discharge Diagnoses Patient Active Problem List   Diagnosis Date Noted  . Fall at home 06/25/2014  . Wrist fracture 06/25/2014  . Traumatic subdural hematoma 06/25/2014  . Multiple trauma 06/24/2014  . Syncope 06/24/2014  . Asystole- captured 16.6 sec pause on tele 06/24/2014  . Sinus node dysfunction     Consultants Dr. Betha LoaKevin Kuzma for hand surgery  Dr. Lisbeth RenshawNeelesh Nundkumar  Drs. Tonny BollmanMichael Cooper and Lewayne BuntingGregg Taylor for cardiology   Procedures 1/6 -- Pacemake implantation by Dr. Ladona Ridgelaylor  1/6 -- Closed reduction of right wrist fracture by Dr. Merlyn LotKuzma   HPI: Elease Hashimotoatricia presented to the ED after a fall. The last thing she remembered was heading upstairs to bed. She believed she fell going up the stairs. She was amnestic of the events. She had no previous history of syncopal episodes. She was brought to the hospital by EMS. Her workup included CT scans of the head and cervical spine as well as extremity x-rays and showed the above-mentioned injuries. She was admitted to the trauma service and neurosurgery and hand surgery were consulted.   Hospital Course: The morning after admission the patient experienced a brief asymptomatic bradycardic episode with a rate in the 40's. Later that morning she had a 16-second asystolic pause. Cardiology was consulted and, given the now likely syncopal event that caused the fall, recommended a pacemaker. This was implanted without difficulty. Hand surgery reduced her wrist fracture and recommended operative fixation as an outpatient. Neurosurgery did not recommend any intervention as she was neurologically intact. She was able to mobilize without difficulty and had minimal pain. She was discharged home in good condition.      Medication List    TAKE these medications        multivitamin tablet  Take 1  tablet by mouth daily.     traMADol 50 MG tablet  Commonly known as:  ULTRAM  Take 1-2 tablets (50-100 mg total) by mouth every 6 (six) hours as needed (Pain).     VITAMIN B-6 PO  Take by mouth.     VITAMIN C PO  Take by mouth as needed.             Follow-up Information    Follow up with [facility] On 07/02/2014.   Why:  3:30 pm , For wound re-check, For suture removal      Follow up with NUNDKUMAR, NEELESH, C, MD. Schedule an appointment as soon as possible for a visit in 4 weeks.   Specialty:  Neurosurgery   Contact information:   [address] [place] [address] [phone]       Follow up with Tami RibasKUZMA,KEVIN R, MD.   Specialty:  Orthopedic Surgery   Contact information:   [address] [place] [address] [phone]       Follow up with [facility].   Why:  As needed   Contact information:   [address] [place] [address] [phone]        Signed: Freeman CaldronMichael J. Chenoah Mcnally, PA-C Pager: [phone] General Trauma PA Pager: [phone] 06/25/2014, 8:46 AM

## 2014-06-25 NOTE — Progress Notes (Signed)
Pt discharged per MD order. All d/c instructions reviewed, all questions answered. Surgical site clean, dry, and intact.  Herma ArdMOSELEY, Gildo Crisco F, RN

## 2014-06-25 NOTE — Discharge Instructions (Signed)
Supplemental Discharge Instructions for  Pacemaker/Defibrillator Patients  Activity No heavy lifting or vigorous activity with your left/right arm for 6 to 8 weeks.  Do not raise your left/right arm above your head for one week.  Gradually raise your affected arm as drawn below.                       06/26/14                06/28/14                 06/30/14                   07/02/14         NO DRIVING for 1 week; you may begin driving on     1/61/091/14/16    . WOUND CARE - Keep the wound area clean and dry.  Do not get this area wet for one week. No showers for one week; you may shower on     07/02/14       . - The tape/steri-strips on your wound will fall off; do not pull them off.  No bandage is needed on the site.  DO  NOT apply any creams, oils, or ointments to the wound area. - If you notice any drainage or discharge from the wound, any swelling or bruising at the site, or you develop a fever > 101? F after you are discharged home, call the office at once.  Special Instructions - You are still able to use cellular telephones; use the ear opposite the side where you have your pacemaker/defibrillator.  Avoid carrying your cellular phone near your device. - When traveling through airports, show security personnel your identification card to avoid being screened in the metal detectors.  Ask the security personnel to use the hand wand. - Avoid arc welding equipment, MRI testing (magnetic resonance imaging), TENS units (transcutaneous nerve stimulators).  Call the office for questions about other devices. - Avoid electrical appliances that are in poor condition or are not properly grounded. - Microwave ovens are safe to be near or to operate.  Additional information for defibrillator patients should your device go off: - If your device goes off ONCE and you feel fine afterward, notify the device clinic nurses. - If your device goes off ONCE and you do not feel well afterward, call 911. - If your device  goes off TWICE, call 911. - If your device goes off THREE times in one day, call 911.  DO NOT DRIVE YOURSELF OR A FAMILY MEMBER WITH A DEFIBRILLATOR TO THE HOSPITAL--CALL 911.  Keep splint dry and intact.

## 2014-06-26 ENCOUNTER — Encounter (HOSPITAL_COMMUNITY): Payer: Self-pay | Admitting: *Deleted

## 2014-06-26 ENCOUNTER — Other Ambulatory Visit: Payer: Self-pay | Admitting: Orthopedic Surgery

## 2014-06-26 NOTE — Progress Notes (Signed)
Reviewed with dr Edmund Hildajoslin-pt recent trama and new pacemaker-needs to be done main or[facility] notified

## 2014-06-28 MED ORDER — CEFAZOLIN SODIUM-DEXTROSE 2-3 GM-% IV SOLR
2.0000 g | INTRAVENOUS | Status: AC
  Administered 2014-06-29: 2 g via INTRAVENOUS
  Filled 2014-06-28: qty 50

## 2014-06-29 ENCOUNTER — Ambulatory Visit (HOSPITAL_COMMUNITY)
Admission: RE | Admit: 2014-06-29 | Discharge: 2014-06-30 | Disposition: A | Discharge status: Home / Self Care | Payer: Medicare Other | Source: Ambulatory Visit | Attending: Orthopedic Surgery | Admitting: Orthopedic Surgery

## 2014-06-29 ENCOUNTER — Ambulatory Visit (HOSPITAL_COMMUNITY): Payer: Medicare Other | Admitting: Anesthesiology

## 2014-06-29 ENCOUNTER — Encounter (HOSPITAL_COMMUNITY)
Admission: RE | Disposition: A | Discharge status: Home / Self Care | Payer: Self-pay | Source: Ambulatory Visit | Attending: Orthopedic Surgery

## 2014-06-29 ENCOUNTER — Encounter (HOSPITAL_COMMUNITY): Payer: Self-pay | Admitting: *Deleted

## 2014-06-29 DIAGNOSIS — S52501A Unspecified fracture of the lower end of right radius, initial encounter for closed fracture: Secondary | ICD-10-CM | POA: Insufficient documentation

## 2014-06-29 DIAGNOSIS — Z950 Presence of cardiac pacemaker: Secondary | ICD-10-CM | POA: Insufficient documentation

## 2014-06-29 DIAGNOSIS — W19XXXA Unspecified fall, initial encounter: Secondary | ICD-10-CM | POA: Diagnosis not present

## 2014-06-29 DIAGNOSIS — R410 Disorientation, unspecified: Secondary | ICD-10-CM | POA: Diagnosis not present

## 2014-06-29 DIAGNOSIS — Y929 Unspecified place or not applicable: Secondary | ICD-10-CM | POA: Insufficient documentation

## 2014-06-29 DIAGNOSIS — Z79899 Other long term (current) drug therapy: Secondary | ICD-10-CM | POA: Diagnosis not present

## 2014-06-29 DIAGNOSIS — J9601 Acute respiratory failure with hypoxia: Secondary | ICD-10-CM

## 2014-06-29 DIAGNOSIS — I499 Cardiac arrhythmia, unspecified: Secondary | ICD-10-CM | POA: Insufficient documentation

## 2014-06-29 DIAGNOSIS — S52509A Unspecified fracture of the lower end of unspecified radius, initial encounter for closed fracture: Secondary | ICD-10-CM | POA: Diagnosis present

## 2014-06-29 HISTORY — DX: Sick sinus syndrome: I49.5

## 2014-06-29 HISTORY — DX: Unspecified intracranial injury with loss of consciousness of unspecified duration, initial encounter: S06.9X9A

## 2014-06-29 HISTORY — DX: Personal history of other specified conditions: Z87.898

## 2014-06-29 HISTORY — PX: OPEN REDUCTION INTERNAL FIXATION (ORIF) DISTAL RADIAL FRACTURE: SHX5989

## 2014-06-29 HISTORY — DX: Syncope and collapse: R55

## 2014-06-29 HISTORY — DX: Cardiac arrhythmia, unspecified: I49.9

## 2014-06-29 LAB — BLOOD GAS, ARTERIAL
ACID-BASE EXCESS: 0.5 mmol/L (ref 0.0–2.0)
BICARBONATE: 23.5 meq/L (ref 20.0–24.0)
DRAWN BY: 362771
FIO2: 0.21 %
O2 Saturation: 96.4 %
PH ART: 7.495 — AB (ref 7.350–7.450)
Patient temperature: 98.6
TCO2: 24.4 mmol/L (ref 0–100)
pCO2 arterial: 30.7 mmHg — ABNORMAL LOW (ref 35.0–45.0)
pO2, Arterial: 85.7 mmHg (ref 80.0–100.0)

## 2014-06-29 SURGERY — OPEN REDUCTION INTERNAL FIXATION (ORIF) DISTAL RADIUS FRACTURE
Anesthesia: General | Site: Arm Lower | Laterality: Right

## 2014-06-29 MED ORDER — HYDROMORPHONE HCL 1 MG/ML IJ SOLN
INTRAMUSCULAR | Status: AC
  Filled 2014-06-29: qty 1

## 2014-06-29 MED ORDER — ROCURONIUM BROMIDE 50 MG/5ML IV SOLN
INTRAVENOUS | Status: AC
  Filled 2014-06-29: qty 1

## 2014-06-29 MED ORDER — PROPOFOL 10 MG/ML IV BOLUS
INTRAVENOUS | Status: DC | PRN
  Administered 2014-06-29: 150 mg via INTRAVENOUS

## 2014-06-29 MED ORDER — CHLORHEXIDINE GLUCONATE 4 % EX LIQD
60.0000 mL | Freq: Once | CUTANEOUS | Status: DC
  Filled 2014-06-29: qty 60

## 2014-06-29 MED ORDER — HYDROMORPHONE HCL 1 MG/ML IJ SOLN
0.2500 mg | INTRAMUSCULAR | Status: DC | PRN
  Administered 2014-06-29 (×3): 0.5 mg via INTRAVENOUS

## 2014-06-29 MED ORDER — OXYCODONE-ACETAMINOPHEN 5-325 MG PO TABS: ORAL_TABLET | ORAL | Status: DC

## 2014-06-29 MED ORDER — ONDANSETRON HCL 4 MG/2ML IJ SOLN
INTRAMUSCULAR | Status: AC
  Filled 2014-06-29: qty 2

## 2014-06-29 MED ORDER — 0.9 % SODIUM CHLORIDE (POUR BTL) OPTIME
TOPICAL | Status: DC | PRN
  Administered 2014-06-29: 1000 mL

## 2014-06-29 MED ORDER — ONDANSETRON HCL 4 MG/2ML IJ SOLN
4.0000 mg | Freq: Once | INTRAMUSCULAR | Status: AC
  Administered 2014-06-29: 4 mg via INTRAVENOUS

## 2014-06-29 MED ORDER — LIDOCAINE HCL (CARDIAC) 20 MG/ML IV SOLN
INTRAVENOUS | Status: DC | PRN
  Administered 2014-06-29: 50 mg via INTRAVENOUS

## 2014-06-29 MED ORDER — FENTANYL CITRATE 0.05 MG/ML IJ SOLN
INTRAMUSCULAR | Status: DC | PRN
  Administered 2014-06-29 (×3): 50 ug via INTRAVENOUS

## 2014-06-29 MED ORDER — LACTATED RINGERS IV SOLN
INTRAVENOUS | Status: DC | PRN
  Administered 2014-06-29: 13:00:00 via INTRAVENOUS

## 2014-06-29 MED ORDER — LIDOCAINE HCL (CARDIAC) 20 MG/ML IV SOLN
INTRAVENOUS | Status: AC
  Filled 2014-06-29: qty 5

## 2014-06-29 MED ORDER — DEXAMETHASONE SODIUM PHOSPHATE 10 MG/ML IJ SOLN
INTRAMUSCULAR | Status: DC | PRN
  Administered 2014-06-29: 8 mg via INTRAVENOUS

## 2014-06-29 MED ORDER — OXYCODONE-ACETAMINOPHEN 5-325 MG PO TABS
1.0000 | ORAL_TABLET | ORAL | Status: DC | PRN
  Administered 2014-06-30: 1 via ORAL
  Administered 2014-06-30 (×2): 2 via ORAL
  Administered 2014-06-30: 1 via ORAL
  Filled 2014-06-29 (×2): qty 1
  Filled 2014-06-29 (×2): qty 2

## 2014-06-29 MED ORDER — LACTATED RINGERS IV SOLN
INTRAVENOUS | Status: DC
  Administered 2014-06-29: 1000 mL via INTRAVENOUS
  Administered 2014-06-29: 11:00:00 via INTRAVENOUS

## 2014-06-29 MED ORDER — BUPIVACAINE HCL (PF) 0.25 % IJ SOLN
INTRAMUSCULAR | Status: AC
  Filled 2014-06-29: qty 30

## 2014-06-29 MED ORDER — BUPIVACAINE HCL (PF) 0.25 % IJ SOLN
INTRAMUSCULAR | Status: DC | PRN
  Administered 2014-06-29: 10 mL

## 2014-06-29 MED ORDER — MIDAZOLAM HCL 2 MG/2ML IJ SOLN
INTRAMUSCULAR | Status: AC
  Filled 2014-06-29: qty 2

## 2014-06-29 MED ORDER — FENTANYL CITRATE 0.05 MG/ML IJ SOLN
INTRAMUSCULAR | Status: AC
  Filled 2014-06-29: qty 5

## 2014-06-29 MED ORDER — ONDANSETRON HCL 4 MG/2ML IJ SOLN
INTRAMUSCULAR | Status: DC | PRN
  Administered 2014-06-29: 4 mg via INTRAVENOUS

## 2014-06-29 SURGICAL SUPPLY — 69 items
BANDAGE ELASTIC 3 VELCRO ST LF (GAUZE/BANDAGES/DRESSINGS) ×1 IMPLANT
BANDAGE ELASTIC 4 VELCRO ST LF (GAUZE/BANDAGES/DRESSINGS) ×2 IMPLANT
BIT DRILL 2.0 LNG QUCK RELEASE (BIT) IMPLANT
BIT DRILL 2.8X5 QR DISP (BIT) ×1 IMPLANT
BLADE SURG ROTATE 9660 (MISCELLANEOUS) IMPLANT
BNDG CMPR 9X4 STRL LF SNTH (GAUZE/BANDAGES/DRESSINGS) ×1
BNDG ESMARK 4X9 LF (GAUZE/BANDAGES/DRESSINGS) ×2 IMPLANT
BNDG GAUZE ELAST 4 BULKY (GAUZE/BANDAGES/DRESSINGS) ×2 IMPLANT
CANISTER SUCTION 2500CC (MISCELLANEOUS) ×1 IMPLANT
CORDS BIPOLAR (ELECTRODE) ×2 IMPLANT
COVER SURGICAL LIGHT HANDLE (MISCELLANEOUS) ×2 IMPLANT
CUFF TOURNIQUET SINGLE 18IN (TOURNIQUET CUFF) ×2 IMPLANT
CUFF TOURNIQUET SINGLE 24IN (TOURNIQUET CUFF) IMPLANT
DECANTER SPIKE VIAL GLASS SM (MISCELLANEOUS) IMPLANT
DRAIN TLS ROUND 10FR (DRAIN) IMPLANT
DRAPE OEC MINIVIEW 54X84 (DRAPES) ×1 IMPLANT
DRAPE SURG 17X23 STRL (DRAPES) ×2 IMPLANT
DRILL 2.0 LNG QUICK RELEASE (BIT) ×2
GAUZE SPONGE 4X4 12PLY STRL (GAUZE/BANDAGES/DRESSINGS) ×2 IMPLANT
GAUZE XEROFORM 1X8 LF (GAUZE/BANDAGES/DRESSINGS) ×2 IMPLANT
GLOVE BIO SURGEON STRL SZ7.5 (GLOVE) ×2 IMPLANT
GLOVE BIOGEL PI IND STRL 8 (GLOVE) ×1 IMPLANT
GLOVE BIOGEL PI INDICATOR 8 (GLOVE) ×1
GOWN STRL REUS W/ TWL LRG LVL3 (GOWN DISPOSABLE) ×3 IMPLANT
GOWN STRL REUS W/ TWL XL LVL3 (GOWN DISPOSABLE) ×3 IMPLANT
GOWN STRL REUS W/TWL LRG LVL3 (GOWN DISPOSABLE) ×6
GOWN STRL REUS W/TWL XL LVL3 (GOWN DISPOSABLE) ×6
GUIDEWIRE ORTHO 0.054X6 (WIRE) ×1 IMPLANT
KIT BASIN OR (CUSTOM PROCEDURE TRAY) ×2 IMPLANT
KIT ROOM TURNOVER OR (KITS) ×2 IMPLANT
LOOP VESSEL MAXI BLUE (MISCELLANEOUS) IMPLANT
MANIFOLD NEPTUNE II (INSTRUMENTS) ×1 IMPLANT
NEEDLE 22X1 1/2 (OR ONLY) (NEEDLE) ×1 IMPLANT
NS IRRIG 1000ML POUR BTL (IV SOLUTION) ×2 IMPLANT
PACK ORTHO EXTREMITY (CUSTOM PROCEDURE TRAY) ×2 IMPLANT
PAD ARMBOARD 7.5X6 YLW CONV (MISCELLANEOUS) ×3 IMPLANT
PAD CAST 4YDX4 CTTN HI CHSV (CAST SUPPLIES) ×1 IMPLANT
PADDING CAST ABS 4INX4YD NS (CAST SUPPLIES) ×1
PADDING CAST ABS COTTON 4X4 ST (CAST SUPPLIES) IMPLANT
PADDING CAST COTTON 4X4 STRL (CAST SUPPLIES)
PLATE STD RT ACULOC 2 (Plate) ×1 IMPLANT
SCREW ACTK 2 NL HEX 3.5.11 (Screw) ×2 IMPLANT
SCREW CORT FT 18X2.3XLCK HEX (Screw) IMPLANT
SCREW CORT FT 20X2.3XLCK HEX (Screw) IMPLANT
SCREW CORTICAL LOCKING 2.3X16M (Screw) ×2 IMPLANT
SCREW CORTICAL LOCKING 2.3X18M (Screw) ×10 IMPLANT
SCREW CORTICAL LOCKING 2.3X20M (Screw) ×2 IMPLANT
SCREW FX16X2.3XLCK SMTH NS CRT (Screw) IMPLANT
SCREW FX18X2.3XSMTH LCK NS CRT (Screw) IMPLANT
SCREW NONLOCK HEX 3.5X12 (Screw) ×1 IMPLANT
SPLINT PLASTER CAST XFAST 3X15 (CAST SUPPLIES) IMPLANT
SPLINT PLASTER XTRA FASTSET 3X (CAST SUPPLIES) ×1
SPONGE LAP 4X18 X RAY DECT (DISPOSABLE) IMPLANT
STRIP CLOSURE SKIN 1/2X4 (GAUZE/BANDAGES/DRESSINGS) ×1 IMPLANT
STRIP CLOSURE SKIN 1/4X3 (GAUZE/BANDAGES/DRESSINGS) ×1 IMPLANT
SUT ETHILON 4 0 P 3 18 (SUTURE) ×1 IMPLANT
SUT ETHILON 4 0 PS 2 18 (SUTURE) ×1 IMPLANT
SUT MNCRL AB 4-0 PS2 18 (SUTURE) ×1 IMPLANT
SUT PROLENE 3 0 PS 2 (SUTURE) IMPLANT
SUT VIC AB 3-0 FS2 27 (SUTURE) IMPLANT
SUT VICRYL 4-0 PS2 18IN ABS (SUTURE) ×1 IMPLANT
SYR CONTROL 10ML LL (SYRINGE) ×1 IMPLANT
SYSTEM CHEST DRAIN TLS 7FR (DRAIN) IMPLANT
TOWEL OR 17X24 6PK STRL BLUE (TOWEL DISPOSABLE) ×2 IMPLANT
TOWEL OR 17X26 10 PK STRL BLUE (TOWEL DISPOSABLE) ×2 IMPLANT
TUBE CONNECTING 12X1/4 (SUCTIONS) ×2 IMPLANT
TUBE EVACUATION TLS (MISCELLANEOUS) ×2 IMPLANT
UNDERPAD 30X30 INCONTINENT (UNDERPADS AND DIAPERS) ×2 IMPLANT
WATER STERILE IRR 1000ML POUR (IV SOLUTION) ×1 IMPLANT

## 2014-06-29 NOTE — Op Note (Signed)
Intra-operative fluoroscopic images in the AP, lateral, and oblique views were taken and evaluated by myself.  Reduction and hardware placement were confirmed.  There was no intraarticular penetration of permanent hardware.  

## 2014-06-29 NOTE — H&P (Addendum)
[facility] Medical Consultation  Jonnie Finneratricia Nong ZOX:[id number] DOB: 1945/08/10 DOA: 06/29/2014 PCP: No primary care provider on file.   Requesting physician: Betha LoaKevin Kuzma, MD Date of consultation: 06/29/2013 Reason for consultation: Hypoventilation  Impression/Recommendations Active Problems:   Distal radius fracture    1. Hypoventilation leading to hypoxia post anesthesia -patient likely had some residual effects of anesthesia leading to hypoventilation and desaturations -would continue with oxygen therapy overnight. -would check an ABG now -should be able to be discharged in the morning   I will followup again tomorrow. Please contact me if I can be of assistance in the meanwhile. Thank you for this consultation.  Chief Complaint: Post Operative evaluation  HPI: 69yo female who had an episode of syncope and came in for a broken radius repair. She states that she is relatively healthy prior to the fall. Patient states that she had a small brain hematoma. She was admitted for observation and during her stay she was noted to have some issues with sinus pauses. She had a pacemaker placed. After this she went home and returned today for repair of her radius. Patient had no issues during surgery but after she had some issues with oxygenation. It was therefore decided to admit her for observation. Currently she is awake and alert and has no specific complaints.   Review of Systems:  Complete 12 point ROS was done and is unremarkable  Past Medical History  Diagnosis Date  . Head injury, acute, with loss of consciousness   . Sinus node dysfunction     extrinsic  . Vaso vagal episode   . History of syncope   . Dysrhythmia     had 16 sec. of asystole that lead to pacemaker placement   Past Surgical History  Procedure Laterality Date  . Permanent pacemaker insertion N/A 06/24/2014    Procedure: PERMANENT PACEMAKER INSERTION;  Surgeon: Marinus MawGregg W Taylor, MD;  [facility];   Service: Cardiovascular;  Laterality: N/A;   Social History:  reports that she has never smoked. She does not have any smokeless tobacco history on file. She reports that she drinks alcohol. Her drug history is not on file.  No Known Allergies Family History  Problem Relation Age of Onset  . Heart disease Father   . Cancer Sister   . Cancer Brother   . Cancer Maternal Grandmother   . Heart disease Maternal Grandfather   . Stroke Paternal Grandmother     Prior to Admission medications   Medication Sig Start Date End Date Taking? Authorizing Provider  Ascorbic Acid (VITAMIN C PO) Take by mouth as needed.   Yes Historical Provider, MD  docusate sodium (COLACE) 100 MG capsule Take 100 mg by mouth daily as needed for mild constipation.   Yes Historical Provider, MD  ibuprofen (ADVIL,MOTRIN) 200 MG tablet Take 400 mg by mouth every 6 (six) hours as needed.   Yes Historical Provider, MD  traMADol (ULTRAM) 50 MG tablet Take 1-2 tablets (50-100 mg total) by mouth every 6 (six) hours as needed (Pain). 06/25/14  Yes Freeman CaldronMichael J Jeffery, PA-C  Multiple Vitamin (MULTIVITAMIN) tablet Take 1 tablet by mouth daily.    Historical Provider, MD  oxyCODONE-acetaminophen (PERCOCET) 5-325 MG per tablet 1-2 tabs po q6 hours prn pain 06/29/14   Betha LoaKevin Kuzma, MD  Pyridoxine HCl (VITAMIN B-6 PO) Take by mouth.    Historical Provider, MD   Physical Exam: Blood pressure 154/78, pulse 74, temperature 97.4 F (36.3 C), temperature source Oral, resp. rate 17,  height  (1.575 m), weight 72.621 kg (160 lb 1.6 oz), SpO2 100 %. Filed Vitals:   06/29/14 1958  BP: 154/78  Pulse: 74  Temp: 97.4 F (36.3 C)  Resp: 17     General:  Awake and alert and oriented  Eyes: EOMI PERRLA  ENT: Mouth clear no exudates  Neck: Supple no JVP  Cardiovascular: RRR no gallop  Respiratory: good air entry no ronchi or rales  Abdomen: Soft non-tender  Skin: no rash  Musculoskeletal: No synovitis no  tenderness  Psychiatric: No depression  Neurologic: Non-focal moves all four extremities  Labs on Admission:  Basic Metabolic Panel:  Recent Labs Lab 06/23/14 2321  NA 138  K 3.2*  CL 108  GLUCOSE 98  BUN 23  CREATININE 0.80   Liver Function Tests:  Recent Labs Lab 06/24/14 0034  AST 43*  ALT 15  ALKPHOS 68  BILITOT 1.0  PROT 6.7  ALBUMIN 4.2   No results for input(s): LIPASE, AMYLASE in the last 168 hours. No results for input(s): AMMONIA in the last 168 hours. CBC:  Recent Labs Lab 06/23/14 2321 06/24/14 0034  WBC  --  13.2*  NEUTROABS  --  12.0*  HGB 13.6 13.5  HCT 40.0 40.0  MCV  --  90.7  PLT  --  296   Cardiac Enzymes: No results for input(s): CKTOTAL, CKMB, CKMBINDEX, TROPONINI in the last 168 hours. BNP: Invalid input(s): POCBNP CBG: No results for input(s): GLUCAP in the last 168 hours.  Radiological Exams on Admission: No results found.   Time spent:  Maricsa Sammons A [facility] Pager [phone]  If 7PM-7AM, please contact night-coverage [email or web address] Password [facility] 06/29/2014, 8:51 PM

## 2014-06-29 NOTE — Anesthesia Procedure Notes (Signed)
Procedure Name: LMA Insertion Date/Time: 06/29/2014 1:48 PM Performed by: Carmela RimaMARTINELLI, Mynor Witkop F Pre-anesthesia Checklist: Patient identified, Timeout performed, Emergency Drugs available, Patient being monitored and Suction available Patient Re-evaluated:Patient Re-evaluated prior to inductionOxygen Delivery Method: Circle system utilized Preoxygenation: Pre-oxygenation with 100% oxygen Intubation Type: IV induction Ventilation: Mask ventilation without difficulty LMA: LMA inserted LMA Size: 4.0 Number of attempts: 1 Placement Confirmation: positive ETCO2 and breath sounds checked- equal and bilateral Tube secured with: Tape Dental Injury: Teeth and Oropharynx as per pre-operative assessment

## 2014-06-29 NOTE — Discharge Instructions (Addendum)
[facility] Instructions °Hand Surgery ° °Wound Care: °Keep your hand elevated above the level of your heart.  Do not allow it to dangle by your side.  Keep the dressing dry and do not remove it unless your doctor advises you to do so.  He will usually change it at the time of your post-op visit.  Moving your fingers is advised to stimulate circulation but will depend on the site of your surgery.  If you have a splint applied, your doctor will advise you regarding movement. ° °Activity: °Do not drive or operate machinery today.  Rest today and then you may return to your normal activity and work as indicated by your physician. ° °Diet:  °Drink liquids today or eat a light diet.  You may resume a regular diet tomorrow.   ° °General expectations: °Pain for two to three days. °Fingers may become slightly swollen. ° °Call your doctor if any of the following occur: °Severe pain not relieved by pain medication. °Elevated temperature. °Dressing soaked with blood. °Inability to move fingers. °White or bluish color to fingers. °What to eat: ° °For your first meals, you should eat lightly; only small meals initially.  If you do not have nausea, you may eat larger meals.  Avoid spicy, greasy and heavy food.   ° °General Anesthesia, Adult, Care After  °Refer to this sheet in the next few weeks. These instructions provide you with information on caring for yourself after your procedure. Your health care provider may also give you more specific instructions. Your treatment has been planned according to current medical practices, but problems sometimes occur. Call your health care provider if you have any problems or questions after your procedure.  °WHAT TO EXPECT AFTER THE PROCEDURE  °After the procedure, it is typical to experience:  °Sleepiness.  °Nausea and vomiting. °HOME CARE INSTRUCTIONS  °For the first 24 hours after general anesthesia:  °Have a responsible person with you.  °Do not drive a car. If you are alone, do not  take public transportation.  °Do not drink alcohol.  °Do not take medicine that has not been prescribed by your health care provider.  °Do not sign important papers or make important decisions.  °You may resume a normal diet and activities as directed by your health care provider.  °Change bandages (dressings) as directed.  °If you have questions or problems that seem related to general anesthesia, call the hospital and ask for the anesthetist or anesthesiologist on call. °SEEK MEDICAL CARE IF:  °You have nausea and vomiting that continue the day after anesthesia.  °You develop a rash. °SEEK IMMEDIATE MEDICAL CARE IF:  °You have difficulty breathing.  °You have chest pain.  °You have any allergic problems. °Document Released: 09/11/2000 Document Revised: 02/05/2013 Document Reviewed: 12/19/2012  °ExitCare® Patient Information ©2014 [employer].  ° ° °

## 2014-06-29 NOTE — Progress Notes (Signed)
Dr Theodosia Palingkuzma/On call MD paged

## 2014-06-29 NOTE — Progress Notes (Signed)
Report received from Sharon RN.

## 2014-06-29 NOTE — H&P (Signed)
  [name] is an 69 y.o. female.   Chief Complaint: right distal radius fracture HPI: 69 yo female fell 6 days ago injuring right wrist.  Seen during admission and closed reduction of right distal radius performed.  Presents today for fixation of right distal radius fracture.  Past Medical History  Diagnosis Date  . Head injury, acute, with loss of consciousness   . Sinus node dysfunction     extrinsic  . Vaso vagal episode   . History of syncope   . Dysrhythmia     had 16 sec. of asystole that lead to pacemaker placement    Past Surgical History  Procedure Laterality Date  . Permanent pacemaker insertion N/A 06/24/2014    Procedure: PERMANENT PACEMAKER INSERTION;  Surgeon: Marinus MawGregg W Taylor, MD;  [facility];  Service: Cardiovascular;  Laterality: N/A;    Family History  Problem Relation Age of Onset  . Heart disease Father   . Cancer Sister   . Cancer Brother   . Cancer Maternal Grandmother   . Heart disease Maternal Grandfather   . Stroke Paternal Grandmother    Social History:  reports that she has never smoked. She does not have any smokeless tobacco history on file. She reports that she drinks alcohol. Her drug history is not on file.  Allergies: No Known Allergies  Medications Prior to Admission  Medication Sig Dispense Refill  . Ascorbic Acid (VITAMIN C PO) Take by mouth as needed.    . docusate sodium (COLACE) 100 MG capsule Take 100 mg by mouth daily as needed for mild constipation.    Marland Kitchen. ibuprofen (ADVIL,MOTRIN) 200 MG tablet Take 400 mg by mouth every 6 (six) hours as needed.    . traMADol (ULTRAM) 50 MG tablet Take 1-2 tablets (50-100 mg total) by mouth every 6 (six) hours as needed (Pain). 30 tablet 0  . Multiple Vitamin (MULTIVITAMIN) tablet Take 1 tablet by mouth daily.    . Pyridoxine HCl (VITAMIN B-6 PO) Take by mouth.      No results found for this or any previous visit (from the past 48 hour(s)).  No results found.   A comprehensive  review of systems was negative.  Blood pressure 129/71, pulse 67, temperature 97.4 F (36.3 C), temperature source Oral, resp. rate 16, height 5\' 2"  (1.575 m), weight 72.621 kg (160 lb 1.6 oz), SpO2 100 %.  General appearance: alert, cooperative and appears stated age Head: Normocephalic, without obvious abnormality, atraumatic Neck: supple, symmetrical, trachea midline Resp: clear to auscultation bilaterally Cardio: regular rate and rhythm GI: non tender Extremities: intact sensation and capillary refill all digits. +epl/fpl/io.  no wounds. Pulses: 2+ and symmetric Skin: Skin color, texture, turgor normal. No rashes or lesions Neurologic: Grossly normal Incision/Wound: none  Assessment/Plan Right distal radius fracture.  Non operative and operative treatment options were discussed with the patient and patient wishes to proceed with operative treatment. Risks, benefits, and alternatives of surgery were discussed and the patient agrees with the plan of care.   My Madariaga R 06/29/2014, 1:14 PM

## 2014-06-29 NOTE — Brief Op Note (Signed)
06/29/2014  3:17 PM  PATIENT:  Jonnie FinnerPatricia Podolski  69 y.o. female  PRE-OPERATIVE DIAGNOSIS:  RIGHT DISTAL RADIUS FRACTURE  POST-OPERATIVE DIAGNOSIS:  RIGHT DISTAL RADIUS FRACTURE  PROCEDURE:  Procedure(s): OPEN REDUCTION INTERNAL FIXATION (ORIF)  RIGHT DISTAL RADIAL FRACTURE (Right)  SURGEON:  Surgeon(s) and Role:    * Betha LoaKevin Juliocesar Blasius, MD - Primary  PHYSICIAN ASSISTANT:   ASSISTANTS: Annye Ruskobert Dasnoit, PA   ANESTHESIA:   general  EBL:     BLOOD ADMINISTERED:none  DRAINS: none   LOCAL MEDICATIONS USED:  MARCAINE     SPECIMEN:  No Specimen  DISPOSITION OF SPECIMEN:  N/A  COUNTS:  YES  TOURNIQUET:   Total Tourniquet Time Documented: Upper Arm (Right) - 69 minutes Total: Upper Arm (Right) - 69 minutes   DICTATION: .Other Dictation: Dictation Number [id number]  PLAN OF CARE: Discharge to home after PACU  PATIENT DISPOSITION:  PACU - hemodynamically stable.

## 2014-06-29 NOTE — Transfer of Care (Signed)
Immediate Anesthesia Transfer of Care Note  Patient: [name]  Procedure(s) Performed: Procedure(s): OPEN REDUCTION INTERNAL FIXATION (ORIF)  RIGHT DISTAL RADIAL FRACTURE (Right)  Patient Location: PACU  Anesthesia Type:General  Level of Consciousness: awake, alert  and oriented  Airway & Oxygen Therapy: Patient Spontanous Breathing  Post-op Assessment: Report given to PACU RN  Post vital signs: Reviewed and stable  Complications: No apparent anesthesia complications

## 2014-06-29 NOTE — Progress Notes (Signed)
Patient desaturated to 78 x2 while asleep.  Patient placed back on [place] oxygen.  Rationale explained to friends who are at the bedside.  Patient also c/o itching, but denied itching when asked if she wants benadryl.

## 2014-06-29 NOTE — Progress Notes (Signed)
Admission orders received per Dr Mina MarbleWeingold

## 2014-06-29 NOTE — Progress Notes (Signed)
Dr Noreene LarssonJoslin at bedside.  States pt may not be discharged home and has requested admission for overnight observation

## 2014-06-29 NOTE — Op Note (Signed)
[id number] 

## 2014-06-29 NOTE — Anesthesia Preprocedure Evaluation (Addendum)
Anesthesia Evaluation  Patient identified by MRN, date of birth, ID band Patient awake    Reviewed: Allergy & Precautions, NPO status , Patient's Chart, lab work & pertinent test results  Airway Mallampati: II       Dental  (+) Teeth Intact, Dental Advidsory Given   Pulmonary neg pulmonary ROS,  breath sounds clear to auscultation        Cardiovascular + dysrhythmias Rhythm:Regular Rate:Normal  Cardiac history noted. CE   Neuro/Psych    GI/Hepatic negative GI ROS, Neg liver ROS,   Endo/Other  negative endocrine ROS  Renal/GU negative Renal ROS     Musculoskeletal negative musculoskeletal ROS (+)   Abdominal   Peds  Hematology negative hematology ROS (+)   Anesthesia Other Findings   Reproductive/Obstetrics                           Anesthesia Physical Anesthesia Plan  ASA: III  Anesthesia Plan: General   Post-op Pain Management:    Induction: Intravenous  Airway Management Planned: LMA  Additional Equipment:   Intra-op Plan:   Post-operative Plan: Extubation in OR  Informed Consent: I have reviewed the patients History and Physical, chart, labs and discussed the procedure including the risks, benefits and alternatives for the proposed anesthesia with the patient or authorized representative who has indicated his/her understanding and acceptance.   Dental Advisory Given  Plan Discussed with: CRNA and Anesthesiologist  Anesthesia Plan Comments:        Anesthesia Quick Evaluation

## 2014-06-29 NOTE — Progress Notes (Signed)
69 year old female underwent ORIF of R. Distal Radius fracture under general anesthesia. She is now in the PACU,3 hours post-op. She is awake and alert but when she is not stimulated, the O2 sat drops to 70%. I discussed these findings with her and by mutual consent, we believe it is best she be admitted for overnight observation.  Kipp Broodavid Dvontae Ruan

## 2014-06-29 NOTE — Progress Notes (Signed)
Patient desaturated in sleep to 70's.  Oxygen turned back on at 2L.

## 2014-06-29 NOTE — Progress Notes (Signed)
Spoke with Dr. Noreene LarssonJoslin about patients continued desaturation episodes and need for oxygen.  Wants to watch for 30 more minutes then will decide about admitting her to the hospital for observation.

## 2014-06-30 ENCOUNTER — Encounter (HOSPITAL_COMMUNITY): Payer: Self-pay | Admitting: Orthopedic Surgery

## 2014-06-30 DIAGNOSIS — S52501A Unspecified fracture of the lower end of right radius, initial encounter for closed fracture: Secondary | ICD-10-CM

## 2014-06-30 DIAGNOSIS — J9601 Acute respiratory failure with hypoxia: Secondary | ICD-10-CM

## 2014-06-30 MED ORDER — OXYCODONE-ACETAMINOPHEN 5-325 MG PO TABS: ORAL_TABLET | ORAL | Status: DC

## 2014-06-30 NOTE — Progress Notes (Signed)
Patient briefly seen and examined. We were consulted last pm for hypoxemia following repair of right distal radius fracture. ABG is normal and oxygen saturations are WNL this am on RA. Likely 2/2 hypoventilation from anesthesia after-effects. Will sign off. Dispo as per orthopedics. Will not charge patient for this visit. Thank you for this consult.  Peggye PittEstela Hernandez, MD [facility] Pager: [phone]

## 2014-06-30 NOTE — Op Note (Signed)
[name], [name] NO.:  [id number]  MEDICAL RECORD NO.:  [id number]  LOCATION:  5N32C                        FACILITY:  [facility]  PHYSICIAN:  Betha Loa, MD        DATE OF BIRTH:  [birth year]  DATE OF PROCEDURE:  06/29/2014 DATE OF DISCHARGE:                              OPERATIVE REPORT   PREOPERATIVE DIAGNOSIS:  Right comminuted intra-articular distal radius fracture.  POSTOPERATIVE DIAGNOSIS:  Right comminuted intra-articular distal radius fracture.  PROCEDURE:  Open reduction and internal fixation right comminuted intra- articular distal radius fracture.  SURGEON:  Betha Loa, MD.  ASSISTANT:  Marveen Reeks. Dasnoit, PA-C.  ANESTHESIA:  General.  IV FLUIDS:  Per anesthesia flow sheet.  ESTIMATED BLOOD LOSS:  Minimal.  COMPLICATIONS:  None.  SPECIMENS:  None.  TOURNIQUET TIME:  69 minutes.  DISPOSITION:  Stable to PACU.  INDICATIONS:  Ms. Harr is a 69 year old right-hand dominant female, who last week fell injuring her right wrist.  She was seen at the [facility] Emergency Department, where radiographs were taken revealing a comminuted intra-articular distal radius fracture.  She was admitted for other issues.  The following day, she was seen in the hospital and closed reduction performed.  She followed up in the office.  We discussed nonoperative and operative treatment options.  She wished to proceed with operative fixation.  Risks, benefits, and alternatives of surgery were discussed including the risk of blood loss, infection, damage to nerves, vessels, tendons, ligaments, bone; failure to surgery, need for additional surgery, complications with wound healing, continued pain, nonunion, malunion, and stiffness.  She voiced understanding of these risks and elected to proceed.  OPERATIVE COURSE:  After being identified preoperatively by myself, the patient and I agreed upon procedure and site of procedure.  Surgical site was marked.  The  risks, benefits, and alternatives of surgery were reviewed and she wished to proceed.  Surgical consent had been signed. She was given IV Ancef as preoperative antibiotic prophylaxis.  She was transported to the operating room and placed on the operating room table in supine position with the right upper extremity on arm board.  General anesthesia was induced by the anesthesiologist.  The right upper extremity was prepped and draped in normal sterile orthopedic fashion. Surgical pause was performed between surgeons, anesthesia, and operating room staff, and all were in agreement as to the patient, procedure, and site of procedure.  Tourniquet at the proximal aspect of the extremity was inflated to 250 mmHg after exsanguination of the limb with Esmarch bandage.  Standard volar [name] approach was used.  Bipolar electrocautery was used to obtain hemostasis in the subcutaneous tissues.  Superficial and deep portions of the FCR tendon sheath were incised and the FCR and FPL swept ulnarly to protect the palmar cutaneous branch of the median nerve.  The brachialis was released.  The pronator quadratus was released and elevated with a periosteal elevator. The fracture site was easily identified.  There was an extruded fragment of bone that had both cortical and articular segments to it.  This was reduced back into the fracture site.  The fracture was cleared of soft tissue interposition and reduced under direct visualization.  The C-arm was used in AP, lateral, and oblique projections to ensure appropriate reduction which was the case.  A standard plate from the Acumed volar distal radial locking set was selected and secured to the bone with the guide pins.  It was adjusted until appropriate reduction and fit had been obtained.  Standard AO drilling and measuring technique was used. A single screw was placed in a slotted hole in the shaft of the plate. The distal holes were filled with locking  pegs with the exception of the styloid holes which were filled with locking screws.  The remaining holes in the shaft of the plate were filled with nonlocking screws.  C- arm was used in AP, lateral, and oblique projections to ensure appropriate reduction and position of hardware, which was the case. There was a small articular fragment which was noted in the joint.  A small arthrotomy was made and the fragment reduced.  The C-arm was again used in radial AP, lateral, and oblique projections to appropriate reduction and position of hardware, which was the case.  The wound was copiously irrigated with sterile saline.  Pronator quadratus was repaired back over top of the plate using 4-0 Vicryl suture in a figure-of-eight fashion.  Single inverted interrupted Vicryl suture was placed in the subcutaneous tissues and the skin was closed with 4-0 nylon in a horizontal mattress fashion.  The wound was injected with 10 mL of 0.25% plain Marcaine to aid in postoperative analgesia.  It was then dressed with sterile Xeroform, 4x4s, and wrapped with a Kerlix bandage.  A volar splint was placed and wrapped with Kerlix and Ace bandage.  Tourniquet was deflated at 69 minutes. Fingertips were pink with brisk capillary refill after deflation of the tourniquet.  The operative drapes were broken down.  The patient was awoken from anesthesia safely.  She was transferred back to the stretcher and taken to PACU in stable condition.  I will see her back in the office in 1 week for postoperative followup.  I will give her Percocet 5/325, 1-2 p.o. q.6 hours p.r.n. pain, dispensed #40.     Betha LoaKevin Wilda Wetherell, MD     KK/MEDQ  D:  06/29/2014  T:  06/30/2014  Job:  [id number]

## 2014-06-30 NOTE — Progress Notes (Signed)
Occupational Therapy Evaluation Patient Details Name: Jonnie Finneratricia Kiser MRN: [id number] DOB: 23-Mar-1946 Today's Date: 06/30/2014    History of Present Illness s/p R distal radius ORIF 06/29/14   Clinical Impression   Patient is functioning at modified independent level with BADLs at this time. She will have friends/family to assist at home. No mobility problems. All OT education has been completed and patient has no further OT needs. OT will sign off.    Follow Up Recommendations  No OT follow up;Supervision/Assistance - 24 hour (friends/family to assist)    Equipment Recommendations  None recommended by OT    Recommendations for Other Services       Precautions / Restrictions Precautions Precautions: Fall Restrictions Weight Bearing Restrictions: Yes RUE Weight Bearing: Non weight bearing      Mobility Bed Mobility                  Transfers Overall transfer level: Independent Equipment used: None                  Balance                                            ADL Overall ADL's : Modified independent                                       General ADL Comments: Patient performed bathing, toileting, dressing, getting clothes out of closet at modified independent level. She has been functioning with R wrist fx since last week at home. Patient reports being able to feed herself with LUE. Educated patient on NWB status, elevation, edema management. She verbalized understanding. She has sling for RUE, she reports MD told her to wear it when she was out.     Vision                     Perception     Praxis      Pertinent Vitals/Pain Pain Assessment: No/denies pain     Hand Dominance Right   Extremity/Trunk Assessment Upper Extremity Assessment Upper Extremity Assessment: RUE deficits/detail RUE Deficits / Details: in splint, not fully assessed, can wiggle fingers and has shoulder ROM RUE: Unable to  fully assess due to immobilization   Lower Extremity Assessment Lower Extremity Assessment: Overall WFL for tasks assessed   Cervical / Trunk Assessment Cervical / Trunk Assessment: Normal   Communication Communication Communication: No difficulties   Cognition Arousal/Alertness: Awake/alert Behavior During Therapy: WFL for tasks assessed/performed Overall Cognitive Status: Within Functional Limits for tasks assessed                     General Comments       Exercises       Shoulder Instructions      Home Living Family/patient expects to be discharged to:: Private residence Living Arrangements: Alone Available Help at Discharge: Family;Friend(s);Available 24 hours/day Type of Home: House Home Access: Level entry     Home Layout: Two level (split level) Alternate Level Stairs-Number of Steps: 3 steps to bedrooms, 5 down to [employer]   Bathroom Shower/Tub: Producer, television/film/videoWalk-in shower   Bathroom Toilet: Standard     Home Equipment: Shower seat - built in;Grab bars - tub/shower  Prior Functioning/Environment Level of Independence: Independent             OT Diagnosis: Other (comment) (R distal radius fx s/p ORIF)   OT Problem List: Impaired UE functional use   OT Treatment/Interventions:      OT Goals(Current goals can be found in the care plan section)    OT Frequency:     Barriers to D/C:            Co-evaluation              End of Session Nurse Communication: Other (comment) (no further therapy needs)  Activity Tolerance: Patient tolerated treatment well Patient left: in chair;with call bell/phone within reach;with family/visitor present   Time: 1610-9604 OT Time Calculation (min): 23 min Charges:  OT General Charges $OT Visit: 1 Procedure OT Evaluation $Initial OT Evaluation Tier I: 1 Procedure OT Treatments $Self Care/Home Management : 8-22 mins G-Codes: OT G-codes **NOT FOR INPATIENT CLASS** Functional Assessment Tool Used:  clinical observation Functional Limitation: Self care Self Care Current Status (V4098): At least 1 percent but less than 20 percent impaired, limited or restricted Self Care Goal Status (J1914): At least 1 percent but less than 20 percent impaired, limited or restricted Self Care Discharge Status [phone]): At least 1 percent but less than 20 percent impaired, limited or restricted  Breaker Springer A 06/30/2014, 10:49 AM

## 2014-06-30 NOTE — Progress Notes (Signed)
PT Cancellation Note  Patient Details Name: [name] MRN: [id number] DOB: 06-Jan-1946   Cancelled Treatment:    Reason Eval/Treat Not Completed: PT screened, no needs identified, will sign off  Thank you,  Van ClinesHolly Eino Whitner, PT  Acute Rehabilitation Services Pager [phone] Office [phone]    Van ClinesGarrigan, Cordia Miklos [facility] 06/30/2014, 12:54 PM

## 2014-06-30 NOTE — Discharge Summary (Signed)
  [id number] 

## 2014-07-01 NOTE — Discharge Summary (Signed)
NAMEllin Saba:  Morimoto, Sakira              ACCOUNT NO.:  [id number]  MEDICAL RECORD NO.:  [id number]  LOCATION:  5N32C                        FACILITY:  [facility]  PHYSICIAN:  Betha LoaKevin Carmelite Violet, MD        DATE OF BIRTH:  February 01, 1946  DATE OF ADMISSION:  06/29/2014 DATE OF DISCHARGE:  06/30/2014                              DISCHARGE SUMMARY   FINAL DIAGNOSIS:  Right comminuted intra-articular distal radius fracture.  PROCEDURE:  Open reduction and internal fixation right distal radius fracture.  HISTORY:  Ms. [name] is a 69 year old female who fell 1 week ago injuring her right wrist.  She was admitted overnight at that time and a pacemaker was placed.  A closed reduction was performed during that admission.  She was discharged and follow up in the office.  We discussed nonoperative and operative treatment options.  She elected to proceed with operative fixation.  She was returned to the hospital on June 29, 2014, for her operative care.  Open reduction and internal fixation of the right distal radius fracture was performed.  There were no complications.  Postoperatively, she had difficulty with maintenance of oxygen saturation and taken off of supplemental oxygen.  It was felt that it was prudent to keep her overnight for observation.  She was admitted overnight.  She did well.  On evaluation today, she is 100% on room air.  It was felt she is safe for discharge home.  She will be discharged home with followup in 1 week.  I will give her Percocet 5/325, 1-2 p.o. q.6 hours p.r.n. pain, dispensed #40.  She will follow up in 1 week.     Betha LoaKevin Kewanda Poland, MD     KK/MEDQ  D:  06/30/2014  T:  07/01/2014  Job:  [id number]

## 2014-07-02 ENCOUNTER — Ambulatory Visit: Payer: Medicare Other

## 2014-07-02 NOTE — Anesthesia Postprocedure Evaluation (Signed)
  Anesthesia Post-op Note  Patient: [name]  Procedure(s) Performed: Procedure(s): OPEN REDUCTION INTERNAL FIXATION (ORIF)  RIGHT DISTAL RADIAL FRACTURE (Right)  Patient Location: PACU  Anesthesia Type:General  Level of Consciousness: awake  Airway and Oxygen Therapy: Patient Spontanous Breathing  Post-op Pain: mild  Post-op Assessment: Post-op Vital signs reviewed  Post-op Vital Signs: Reviewed  Last Vitals:  Filed Vitals:   06/30/14 0338  BP: 115/57  Pulse: 77  Temp: 37.1 C  Resp: 18    Complications: No apparent anesthesia complications

## 2014-07-03 ENCOUNTER — Ambulatory Visit (INDEPENDENT_AMBULATORY_CARE_PROVIDER_SITE_OTHER): Payer: Medicare Other | Admitting: *Deleted

## 2014-07-03 DIAGNOSIS — I469 Cardiac arrest, cause unspecified: Secondary | ICD-10-CM | POA: Diagnosis not present

## 2014-07-03 DIAGNOSIS — I495 Sick sinus syndrome: Secondary | ICD-10-CM

## 2014-07-03 LAB — MDC_IDC_ENUM_SESS_TYPE_INCLINIC
Battery Remaining Longevity: 141.6 mo
Battery Voltage: 3.02 V
Brady Statistic RA Percent Paced: 0.55 %
Implantable Pulse Generator Model: 2240
Lead Channel Impedance Value: 487.5 Ohm
Lead Channel Pacing Threshold Amplitude: 0.75 V
Lead Channel Pacing Threshold Amplitude: 0.75 V
Lead Channel Pacing Threshold Amplitude: 0.75 V
Lead Channel Pacing Threshold Pulse Width: 0.4 ms
Lead Channel Pacing Threshold Pulse Width: 0.4 ms
Lead Channel Pacing Threshold Pulse Width: 0.4 ms
Lead Channel Setting Pacing Amplitude: 3.5 V
Lead Channel Setting Pacing Pulse Width: 0.4 ms
MDC IDC MSMT LEADCHNL RA IMPEDANCE VALUE: 412.5 Ohm
MDC IDC MSMT LEADCHNL RA SENSING INTR AMPL: 2.9 mV
MDC IDC MSMT LEADCHNL RV SENSING INTR AMPL: 12 mV
MDC IDC PG SERIAL: 7705987
MDC IDC SESS DTM: 20160115161829
MDC IDC SET LEADCHNL RV PACING AMPLITUDE: 1 V
MDC IDC SET LEADCHNL RV SENSING SENSITIVITY: 2 mV
MDC IDC STAT BRADY RV PERCENT PACED: 0.03 %

## 2014-07-03 NOTE — Progress Notes (Signed)
Wound check appointment. Steri-strips removed. Wound without redness or edema. Incision edges approximated, wound well healed. Stitch removed from L corner of incision site. Normal device function. Thresholds, sensing, and impedances consistent with implant measurements. Device programmed at 3.5V and auto capture programmed on for extra safety margin until 3 month visit. Histogram distribution appropriate for patient and level of activity. No mode switches or high ventricular rates noted. Patient educated about wound care, arm mobility, lifting restrictions. ROV in 3 months with GT.

## 2014-07-24 ENCOUNTER — Encounter: Payer: Self-pay | Admitting: Internal Medicine

## 2014-07-28 ENCOUNTER — Encounter: Payer: Self-pay | Admitting: Internal Medicine

## 2014-07-31 ENCOUNTER — Telehealth: Payer: Self-pay | Admitting: Internal Medicine

## 2014-07-31 ENCOUNTER — Ambulatory Visit (INDEPENDENT_AMBULATORY_CARE_PROVIDER_SITE_OTHER): Payer: Medicare Other | Admitting: *Deleted

## 2014-07-31 DIAGNOSIS — R52 Pain, unspecified: Secondary | ICD-10-CM

## 2014-07-31 LAB — MDC_IDC_ENUM_SESS_TYPE_INCLINIC
Implantable Pulse Generator Model: 2240
Implantable Pulse Generator Serial Number: 7705987

## 2014-07-31 NOTE — Telephone Encounter (Signed)
Device clinic appt made today while Dr. Ladona Ridgelaylor here.

## 2014-07-31 NOTE — Telephone Encounter (Signed)
New problem   Pt had a pacer implanted 5 week ago and having pain, redness and puss at the site. Please advise pt.

## 2014-07-31 NOTE — Progress Notes (Signed)
Pt has stitch abscess. No fever or chills. Wound addressed by Dr. Ladona Ridgelaylor. Pt instructed to wash BID, no bra strap directly on wound. Pt instructed to contact device clinic if wound becomes red. Pt will fill keflex prescription as directed if necessary.    Device not interrogated.   ROV w/ Dr. Ladona Ridgelaylor 10/06/14.

## 2014-08-05 ENCOUNTER — Ambulatory Visit (HOSPITAL_BASED_OUTPATIENT_CLINIC_OR_DEPARTMENT_OTHER): Payer: BC Managed Care – PPO

## 2014-08-26 ENCOUNTER — Encounter: Payer: Self-pay | Admitting: Internal Medicine

## 2014-08-30 ENCOUNTER — Encounter: Payer: Self-pay | Admitting: Internal Medicine

## 2014-10-01 ENCOUNTER — Encounter: Payer: Self-pay | Admitting: Internal Medicine

## 2014-10-06 ENCOUNTER — Encounter: Payer: Self-pay | Admitting: Internal Medicine

## 2014-10-06 ENCOUNTER — Ambulatory Visit (INDEPENDENT_AMBULATORY_CARE_PROVIDER_SITE_OTHER): Payer: Medicare Other | Admitting: Internal Medicine

## 2014-10-06 ENCOUNTER — Other Ambulatory Visit: Payer: Self-pay

## 2014-10-06 VITALS — BP 120/76 | HR 65 | Ht 62.75 in | Wt 162.2 lb

## 2014-10-06 DIAGNOSIS — Z950 Presence of cardiac pacemaker: Secondary | ICD-10-CM

## 2014-10-06 DIAGNOSIS — W19XXXS Unspecified fall, sequela: Secondary | ICD-10-CM

## 2014-10-06 DIAGNOSIS — I495 Sick sinus syndrome: Secondary | ICD-10-CM

## 2014-10-06 DIAGNOSIS — Y92009 Unspecified place in unspecified non-institutional (private) residence as the place of occurrence of the external cause: Secondary | ICD-10-CM

## 2014-10-06 DIAGNOSIS — I469 Cardiac arrest, cause unspecified: Secondary | ICD-10-CM

## 2014-10-06 DIAGNOSIS — I459 Conduction disorder, unspecified: Secondary | ICD-10-CM | POA: Diagnosis not present

## 2014-10-06 LAB — MDC_IDC_ENUM_SESS_TYPE_INCLINIC
Battery Remaining Longevity: 138 mo
Battery Voltage: 3.04 V
Brady Statistic RA Percent Paced: 9.1 %
Date Time Interrogation Session: 20160419112249
Implantable Pulse Generator Serial Number: 7705987
Lead Channel Impedance Value: 550 Ohm
Lead Channel Pacing Threshold Amplitude: 0.5 V
Lead Channel Pacing Threshold Amplitude: 0.75 V
Lead Channel Pacing Threshold Pulse Width: 0.4 ms
Lead Channel Pacing Threshold Pulse Width: 0.4 ms
Lead Channel Sensing Intrinsic Amplitude: 2.8 mV
Lead Channel Setting Pacing Amplitude: 0.75 V
Lead Channel Setting Pacing Pulse Width: 0.4 ms
Lead Channel Setting Sensing Sensitivity: 2 mV
MDC IDC MSMT LEADCHNL RA IMPEDANCE VALUE: 462.5 Ohm
MDC IDC MSMT LEADCHNL RV SENSING INTR AMPL: 12 mV
MDC IDC SET LEADCHNL RA PACING AMPLITUDE: 2 V
MDC IDC STAT BRADY RV PERCENT PACED: 0.03 %

## 2014-10-06 NOTE — Progress Notes (Signed)
HPI [name] returns today for pacemaker follow-up. She is a very pleasant 69 year old woman who presented to the hospital approximately 3 months ago with a syncopal episode, resulting in a fractured arm and subdural hematoma. While on the monitor, the patient had a 16 second pause with asystole. She underwent permanent pacemaker insertion thereafter. She returns today for follow-up. In the interim, she has not had syncope and feels well. No Known Allergies   Current Outpatient Prescriptions  Medication Sig Dispense Refill  . Ascorbic Acid (VITAMIN C PO) Take by mouth as needed.    . docusate sodium (COLACE) 100 MG capsule Take 100 mg by mouth daily as needed for mild constipation.    Marland Kitchen ibuprofen (ADVIL,MOTRIN) 200 MG tablet Take 400 mg by mouth every 6 (six) hours as needed.    . Multiple Vitamin (MULTIVITAMIN) tablet Take 1 tablet by mouth daily.    Marland Kitchen oxyCODONE-acetaminophen (PERCOCET) 5-325 MG per tablet 1-2 tabs po q6 hours prn pain (Patient not taking: Reported on 07/03/2014) 40 tablet 0  . Pyridoxine HCl (VITAMIN B-6 PO) Take by mouth.    . traMADol (ULTRAM) 50 MG tablet Take 1-2 tablets (50-100 mg total) by mouth every 6 (six) hours as needed (Pain). 30 tablet 0   No current facility-administered medications for this visit.     Past Medical History  Diagnosis Date  . Head injury, acute, with loss of consciousness   . Sinus node dysfunction     extrinsic  . Vaso vagal episode   . History of syncope   . Dysrhythmia     had 16 sec. of asystole that lead to pacemaker placement    ROS:   All systems reviewed and negative except as noted in the HPI.   Past Surgical History  Procedure Laterality Date  . Permanent pacemaker insertion N/A 06/24/2014    Procedure: PERMANENT PACEMAKER INSERTION;  Surgeon: Marinus Maw, MD;  Location: [facility] CATH LAB;  Service: Cardiovascular;  Laterality: N/A;  . Open reduction internal fixation (orif) distal radial fracture Right 06/29/2014      Procedure: OPEN REDUCTION INTERNAL FIXATION (ORIF)  RIGHT DISTAL RADIAL FRACTURE;  Surgeon: Betha Loa, MD;  Location: [facility] OR;  Service: Orthopedics;  Laterality: Right;     Family History  Problem Relation Age of Onset  . Heart disease Father   . Cancer Sister   . Cancer Brother   . Cancer Maternal Grandmother   . Heart disease Maternal Grandfather   . Stroke Paternal Grandmother      History   Social History  . Marital Status: Single    Spouse Name: N/A  . Number of Children: N/A  . Years of Education: N/A   Occupational History  . Not on file.   Social History Main Topics  . Smoking status: Never Smoker   . Smokeless tobacco: Not on file  . Alcohol Use: Yes     Comment: WINE less than 1 glass a week  . Drug Use: Not on file  . Sexual Activity: Not on file   Other Topics Concern  . Not on file   Social History Narrative     Ht 5' 2.75" (1.594 m)  Wt 162 lb 3.2 oz (73.573 kg)  BMI 28.96 kg/m2  Physical Exam:  Well appearing 69 year old woman, NAD HEENT: Unremarkable Neck:  6 cm JVD, no thyromegally Back:  No CVA tenderness Lungs:  Clear, with no wheezes, rales, or rhonchi. Well-healed pacemaker incision. HEART:  Regular rate  rhythm, no murmurs, no rubs, no clicks Abd:  soft, positive bowel sounds, no organomegally, no rebound, no guarding Ext:  2 plus pulses, no edema, no cyanosis, no clubbing Skin:  No rashes no nodules Neuro:  CN II through XII intact, motor grossly intact  EKG - normal sinus rhythm  DEVICE  Normal device function.  See PaceArt for details.   Assess/Plan:

## 2014-10-06 NOTE — Patient Instructions (Addendum)
Medication Instructions:  Your physician recommends that you continue on your current medications as directed. Please refer to the Current Medication list given to you today.   Labwork: NONE  Testing/Procedures: NONE  Follow-Up: Your physician wants you to follow-up in: 9 months with Dr. Ladona Ridgelaylor. You will receive a reminder letter in the mail two months in advance. If you don't receive a letter, please call our office to schedule the follow-up appointment.  Remote monitoring is used to monitor your Pacemaker or ICD from home. This monitoring reduces the number of office visits required to check your device to one time per year. It allows us to keep an eye on the functioning of your device to ensure it is working properly. You are scheduled for a device check from home on 01/05/2015. You may send your transmission at any time that day. If you have a wireless device, the transmission will be sent automatically. After your physician reviews your transmission, you will receive a postcard with your next transmission date.     Any Other Special Instructions Will Be Listed Below (If Applicable).

## 2014-10-06 NOTE — Assessment & Plan Note (Signed)
She has had no recurrent falls since her pacemaker was placed. We discussed the warning signs of a vagal episode and discussed the importance of becoming supine if one should occur.

## 2014-10-06 NOTE — Assessment & Plan Note (Signed)
She is doing well after her pacemaker has been inserted with no syncope.

## 2014-10-06 NOTE — Assessment & Plan Note (Signed)
Her St. Jude dual-chamber pacemaker is working normally. We'll plan to recheck in several months. 

## 2014-12-14 ENCOUNTER — Other Ambulatory Visit: Payer: Self-pay

## 2014-12-28 ENCOUNTER — Encounter: Payer: Self-pay | Admitting: Internal Medicine

## 2015-01-05 ENCOUNTER — Encounter: Payer: Self-pay | Admitting: Internal Medicine

## 2015-01-05 ENCOUNTER — Ambulatory Visit (INDEPENDENT_AMBULATORY_CARE_PROVIDER_SITE_OTHER): Payer: Medicare Other | Admitting: *Deleted

## 2015-01-05 DIAGNOSIS — I495 Sick sinus syndrome: Secondary | ICD-10-CM | POA: Diagnosis not present

## 2015-01-05 NOTE — Progress Notes (Signed)
Remote pacemaker transmission.   

## 2015-01-06 LAB — CUP PACEART REMOTE DEVICE CHECK
Battery Remaining Percentage: 95.5 %
Brady Statistic AP VP Percent: 1 %
Brady Statistic AS VP Percent: 1 %
Brady Statistic AS VS Percent: 81 %
Brady Statistic RA Percent Paced: 19 %
Brady Statistic RV Percent Paced: 1 %
Date Time Interrogation Session: 20160719060016
Lead Channel Impedance Value: 380 Ohm
Lead Channel Impedance Value: 530 Ohm
Lead Channel Pacing Threshold Amplitude: 0.75 V
Lead Channel Sensing Intrinsic Amplitude: 1.3 mV
Lead Channel Sensing Intrinsic Amplitude: 12 mV
Lead Channel Setting Pacing Amplitude: 0.75 V
Lead Channel Setting Sensing Sensitivity: 2 mV
MDC IDC MSMT BATTERY REMAINING LONGEVITY: 128 mo
MDC IDC MSMT BATTERY VOLTAGE: 3.02 V
MDC IDC MSMT LEADCHNL RA PACING THRESHOLD PULSEWIDTH: 0.4 ms
MDC IDC MSMT LEADCHNL RV PACING THRESHOLD AMPLITUDE: 0.5 V
MDC IDC MSMT LEADCHNL RV PACING THRESHOLD PULSEWIDTH: 0.4 ms
MDC IDC SET LEADCHNL RA PACING AMPLITUDE: 2 V
MDC IDC SET LEADCHNL RV PACING PULSEWIDTH: 0.4 ms
MDC IDC STAT BRADY AP VS PERCENT: 19 %
Pulse Gen Serial Number: 7705987

## 2015-01-22 ENCOUNTER — Encounter: Payer: Self-pay | Admitting: Cardiology

## 2015-04-06 ENCOUNTER — Ambulatory Visit (INDEPENDENT_AMBULATORY_CARE_PROVIDER_SITE_OTHER): Payer: Medicare Other | Admitting: *Deleted

## 2015-04-06 ENCOUNTER — Telehealth: Payer: Self-pay | Admitting: Cardiology

## 2015-04-06 ENCOUNTER — Encounter: Payer: Self-pay | Admitting: Cardiology

## 2015-04-06 DIAGNOSIS — I495 Sick sinus syndrome: Secondary | ICD-10-CM | POA: Diagnosis not present

## 2015-04-06 LAB — CUP PACEART REMOTE DEVICE CHECK
Battery Remaining Longevity: 127 mo
Battery Remaining Percentage: 95.5 %
Battery Voltage: 3.02 V
Brady Statistic AP VS Percent: 18 %
Brady Statistic AS VP Percent: 1 %
Brady Statistic RA Percent Paced: 18 %
Brady Statistic RV Percent Paced: 1 %
Implantable Lead Implant Date: 20160106
Implantable Lead Location: 753859
Lead Channel Impedance Value: 390 Ohm
Lead Channel Impedance Value: 510 Ohm
Lead Channel Pacing Threshold Amplitude: 0.5 V
Lead Channel Sensing Intrinsic Amplitude: 1.9 mV
Lead Channel Setting Pacing Amplitude: 0.75 V
MDC IDC LEAD IMPLANT DT: 20160106
MDC IDC LEAD LOCATION: 753860
MDC IDC MSMT LEADCHNL RV PACING THRESHOLD PULSEWIDTH: 0.4 ms
MDC IDC MSMT LEADCHNL RV SENSING INTR AMPL: 12 mV
MDC IDC SESS DTM: 20161018145731
MDC IDC SET LEADCHNL RA PACING AMPLITUDE: 2 V
MDC IDC SET LEADCHNL RV PACING PULSEWIDTH: 0.4 ms
MDC IDC SET LEADCHNL RV SENSING SENSITIVITY: 2 mV
MDC IDC STAT BRADY AP VP PERCENT: 1 %
MDC IDC STAT BRADY AS VS PERCENT: 82 %
Pulse Gen Serial Number: 7705987

## 2015-04-06 NOTE — Telephone Encounter (Signed)
Spoke with pt and reminded pt of remote transmission that is due today. Pt verbalized understanding.   

## 2015-04-06 NOTE — Progress Notes (Signed)
Remote pacemaker transmission.   

## 2015-04-21 ENCOUNTER — Encounter: Payer: Self-pay | Admitting: Cardiology

## 2015-07-21 ENCOUNTER — Encounter: Payer: Self-pay | Admitting: *Deleted

## 2015-08-31 ENCOUNTER — Encounter: Payer: Self-pay | Admitting: Internal Medicine

## 2015-08-31 ENCOUNTER — Ambulatory Visit (INDEPENDENT_AMBULATORY_CARE_PROVIDER_SITE_OTHER): Payer: Medicare Other | Admitting: Internal Medicine

## 2015-08-31 VITALS — BP 120/70 | HR 60 | Ht 63.0 in | Wt 165.4 lb

## 2015-08-31 DIAGNOSIS — I495 Sick sinus syndrome: Secondary | ICD-10-CM

## 2015-08-31 LAB — CUP PACEART INCLINIC DEVICE CHECK
Battery Voltage: 3.02 V
Date Time Interrogation Session: 20170314102308
Implantable Lead Implant Date: 20160106
Implantable Lead Implant Date: 20160106
Implantable Lead Location: 753859
Lead Channel Impedance Value: 362.5 Ohm
Lead Channel Impedance Value: 512.5 Ohm
Lead Channel Pacing Threshold Amplitude: 0.5 V
Lead Channel Pacing Threshold Pulse Width: 0.4 ms
Lead Channel Pacing Threshold Pulse Width: 0.4 ms
Lead Channel Pacing Threshold Pulse Width: 0.4 ms
Lead Channel Sensing Intrinsic Amplitude: 12 mV
Lead Channel Setting Pacing Amplitude: 0.75 V
Lead Channel Setting Sensing Sensitivity: 2 mV
MDC IDC LEAD LOCATION: 753860
MDC IDC MSMT BATTERY REMAINING LONGEVITY: 139.2
MDC IDC MSMT LEADCHNL RA PACING THRESHOLD AMPLITUDE: 0.5 V
MDC IDC MSMT LEADCHNL RA SENSING INTR AMPL: 2.3 mV
MDC IDC MSMT LEADCHNL RV PACING THRESHOLD AMPLITUDE: 0.5 V
MDC IDC SET LEADCHNL RA PACING AMPLITUDE: 2 V
MDC IDC SET LEADCHNL RV PACING PULSEWIDTH: 0.4 ms
MDC IDC STAT BRADY RA PERCENT PACED: 16 %
MDC IDC STAT BRADY RV PERCENT PACED: 0.04 %
Pulse Gen Model: 2240
Pulse Gen Serial Number: 7705987

## 2015-08-31 NOTE — Progress Notes (Signed)
HPI [name] returns today for pacemaker follow-up. She is a very pleasant 70 year old woman who presented to the hospital approximately 3 months ago with a syncopal episode, resulting in a fractured arm and subdural hematoma. While on the monitor, the patient had a 16 second pause with asystole. She underwent permanent pacemaker insertion thereafter. She returns today for follow-up. In the interim, she has not had syncope and feels well. She admits to some dietary indiscretion. No Known Allergies   Current Outpatient Prescriptions  Medication Sig Dispense Refill  . Ascorbic Acid (VITAMIN C PO) Take 1 capsule by mouth daily as needed (Vitamin supplement).     . Calcium-Magnesium-Vitamin D (CALCIUM MAGNESIUM PO) Take 1 capsule by mouth daily.    . Cholecalciferol (VITAMIN D3) 5000 UNITS CAPS Take 1 capsule by mouth daily.    Marland Kitchen. ibuprofen (ADVIL,MOTRIN) 200 MG tablet Take 400 mg by mouth every 6 (six) hours as needed (pain).     . Multiple Vitamin (MULTIVITAMIN) tablet Take 1 tablet by mouth daily as needed (Vitamin supplement).     . Omega-3 Fatty Acids (FISH OIL PO) Take 900 mg by mouth daily.    . Pyridoxine HCl (VITAMIN B-6 PO) Take 1 capsule by mouth daily as needed (Vitamin supplement).      No current facility-administered medications for this visit.     Past Medical History  Diagnosis Date  . Head injury, acute, with loss of consciousness (HCC)   . Sinus node dysfunction (HCC)     extrinsic  . Vaso vagal episode   . History of syncope   . Dysrhythmia     had 16 sec. of asystole that lead to pacemaker placement    ROS:   All systems reviewed and negative except as noted in the HPI.   Past Surgical History  Procedure Laterality Date  . Permanent pacemaker insertion N/A 06/24/2014    Procedure: PERMANENT PACEMAKER INSERTION;  Surgeon: Marinus MawGregg W Deaven Barron, MD;  [facility];  Service: Cardiovascular;  Laterality: N/A;  . Open reduction internal fixation (orif)  distal radial fracture Right 06/29/2014    Procedure: OPEN REDUCTION INTERNAL FIXATION (ORIF)  RIGHT DISTAL RADIAL FRACTURE;  Surgeon: Betha LoaKevin Kuzma, MD;  Location: [facility] OR;  Service: Orthopedics;  Laterality: Right;     Family History  Problem Relation Age of Onset  . Heart disease Father   . Cancer Sister   . Cancer Brother   . Cancer Maternal Grandmother   . Heart disease Maternal Grandfather   . Stroke Paternal Grandmother      Social History   Social History  . Marital Status: Single    Spouse Name: N/A  . Number of Children: N/A  . Years of Education: N/A   Occupational History  . Not on file.   Social History Main Topics  . Smoking status: Never Smoker   . Smokeless tobacco: Not on file  . Alcohol Use: Yes     Comment: WINE less than 1 glass a week  . Drug Use: Not on file  . Sexual Activity: Not on file   Other Topics Concern  . Not on file   Social History Narrative     BP 120/70 mmHg  Pulse 60  Ht 5\' 3"  (1.6 m)  Wt 165 lb 6.4 oz (75.025 kg)  BMI 29.31 kg/m2  Physical Exam:  Well appearing 70 year old woman, NAD HEENT: Unremarkable Neck:  6 cm JVD, no thyromegally Back:  No CVA tenderness Lungs:  Clear,  with no wheezes, rales, or rhonchi. Well-healed pacemaker incision. HEART:  Regular rate rhythm, no murmurs, no rubs, no clicks Abd:  soft, positive bowel sounds, no organomegally, no rebound, no guarding Ext:  2 plus pulses, no edema, no cyanosis, no clubbing Skin:  No rashes no nodules Neuro:  CN II through XII intact, motor grossly intact  EKG - normal sinus rhythm  DEVICE  Normal device function.  See PaceArt for details.   Assess/Plan: 1. Sinus node dysfunction - she is s/p PPM and doing well.  2. HTN - her blood pressure is well controlled. Will follow. 3. PPM - her device is working normally.  Will follow. Leonia Reeves.D.

## 2015-08-31 NOTE — Patient Instructions (Signed)
Medication Instructions:  Your physician recommends that you continue on your current medications as directed. Please refer to the Current Medication list given to you today.   Labwork: None ordered   Testing/Procedures: None ordered   Follow-Up: Your physician wants you to follow-up in: 12 months with Dr Taylor You will receive a reminder letter in the mail two months in advance. If you don't receive a letter, please call our office to schedule the follow-up appointment.   Remote monitoring is used to monitor your Pacemaker from home. This monitoring reduces the number of office visits required to check your device to one time per year. It allows us to keep an eye on the functioning of your device to ensure it is working properly. You are scheduled for a device check from home on 11/30/15. You may send your transmission at any time that day. If you have a wireless device, the transmission will be sent automatically. After your physician reviews your transmission, you will receive a postcard with your next transmission date.    Any Other Special Instructions Will Be Listed Below (If Applicable).     If you need a refill on your cardiac medications before your next appointment, please call your pharmacy.   

## 2015-11-09 IMAGING — CR DG CHEST 2V
2 series · 2 of 2 positions shown · non-contrast
Comparison: None.

CLINICAL DATA: Pacemaker

EXAM:
CHEST  2 VIEW

[chest pa]
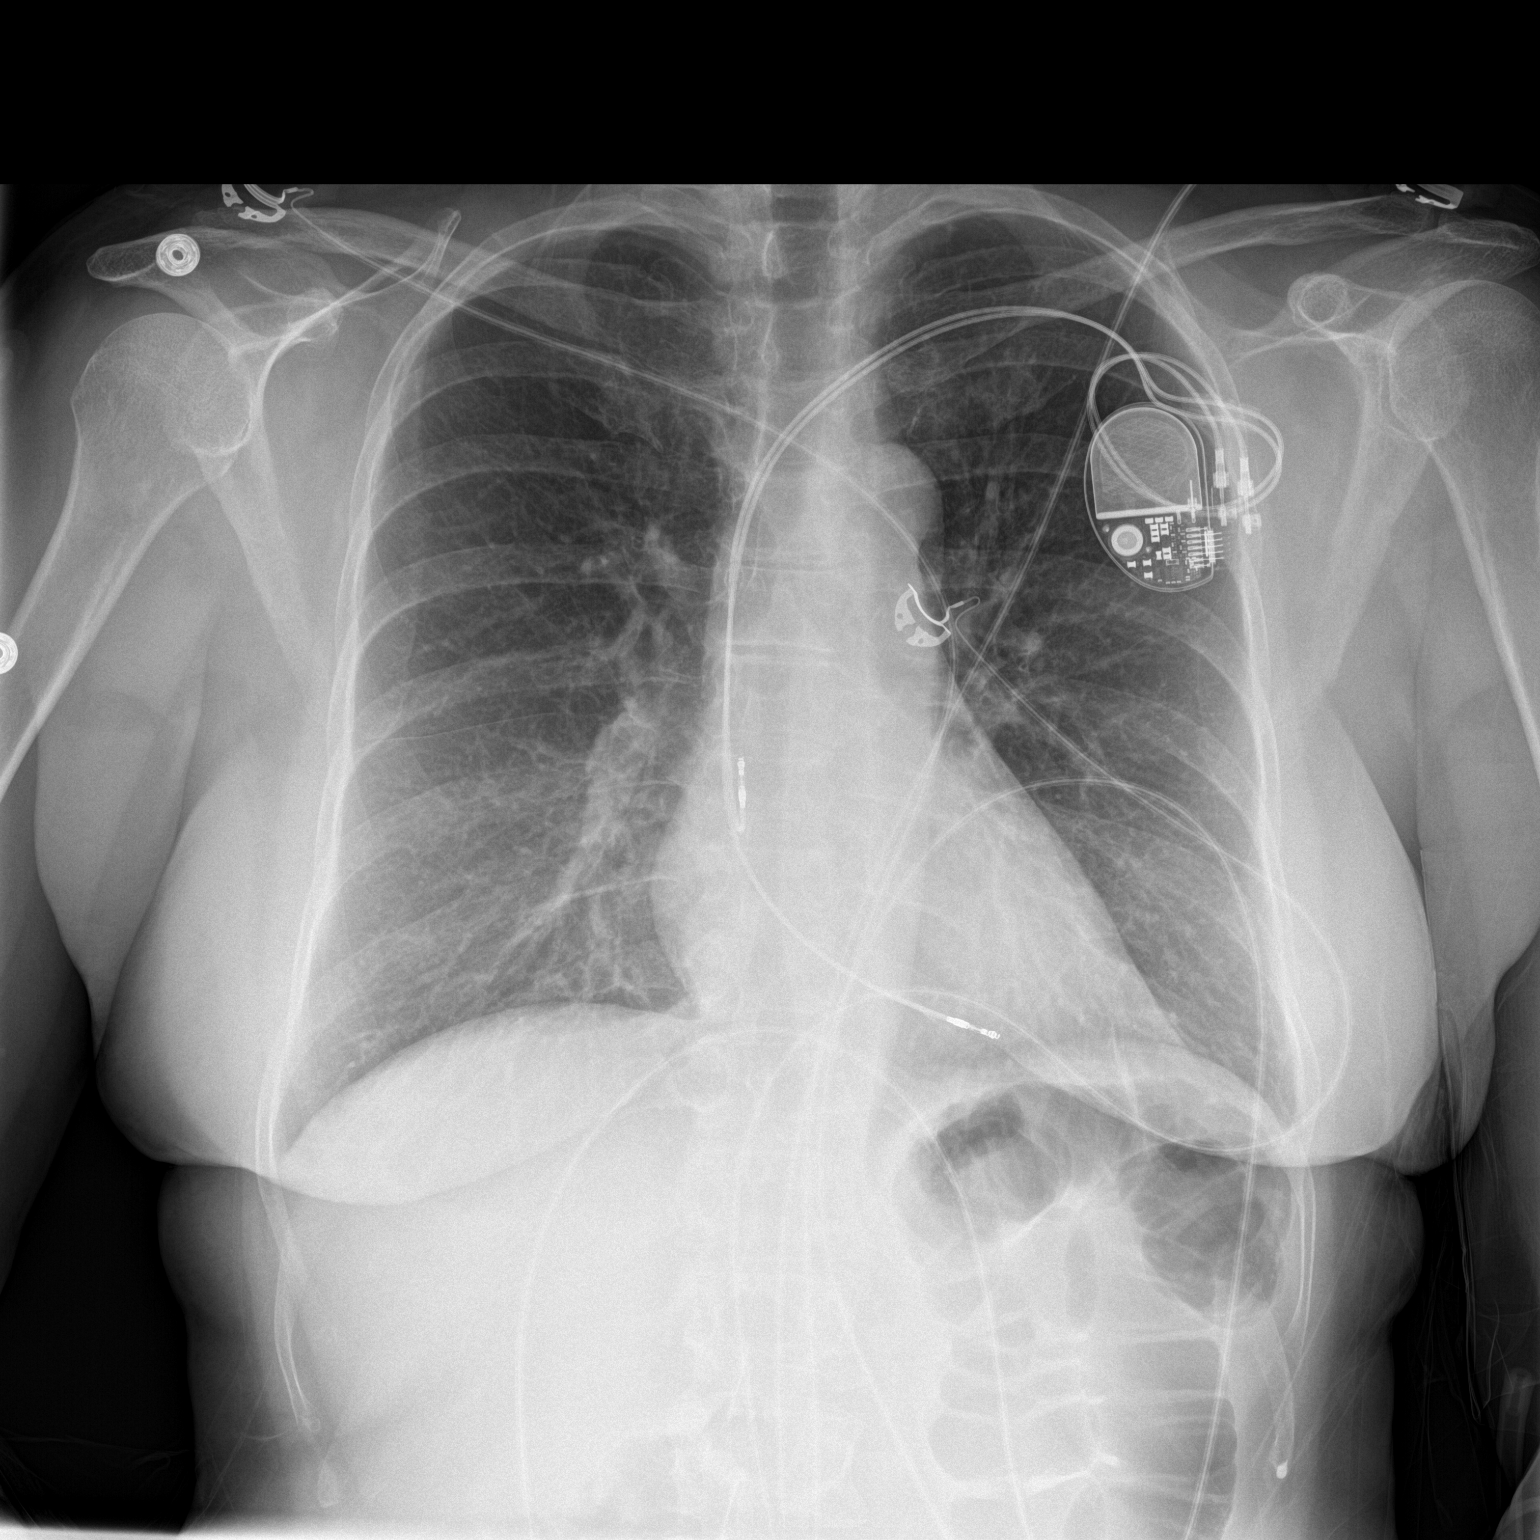

[chest lat]
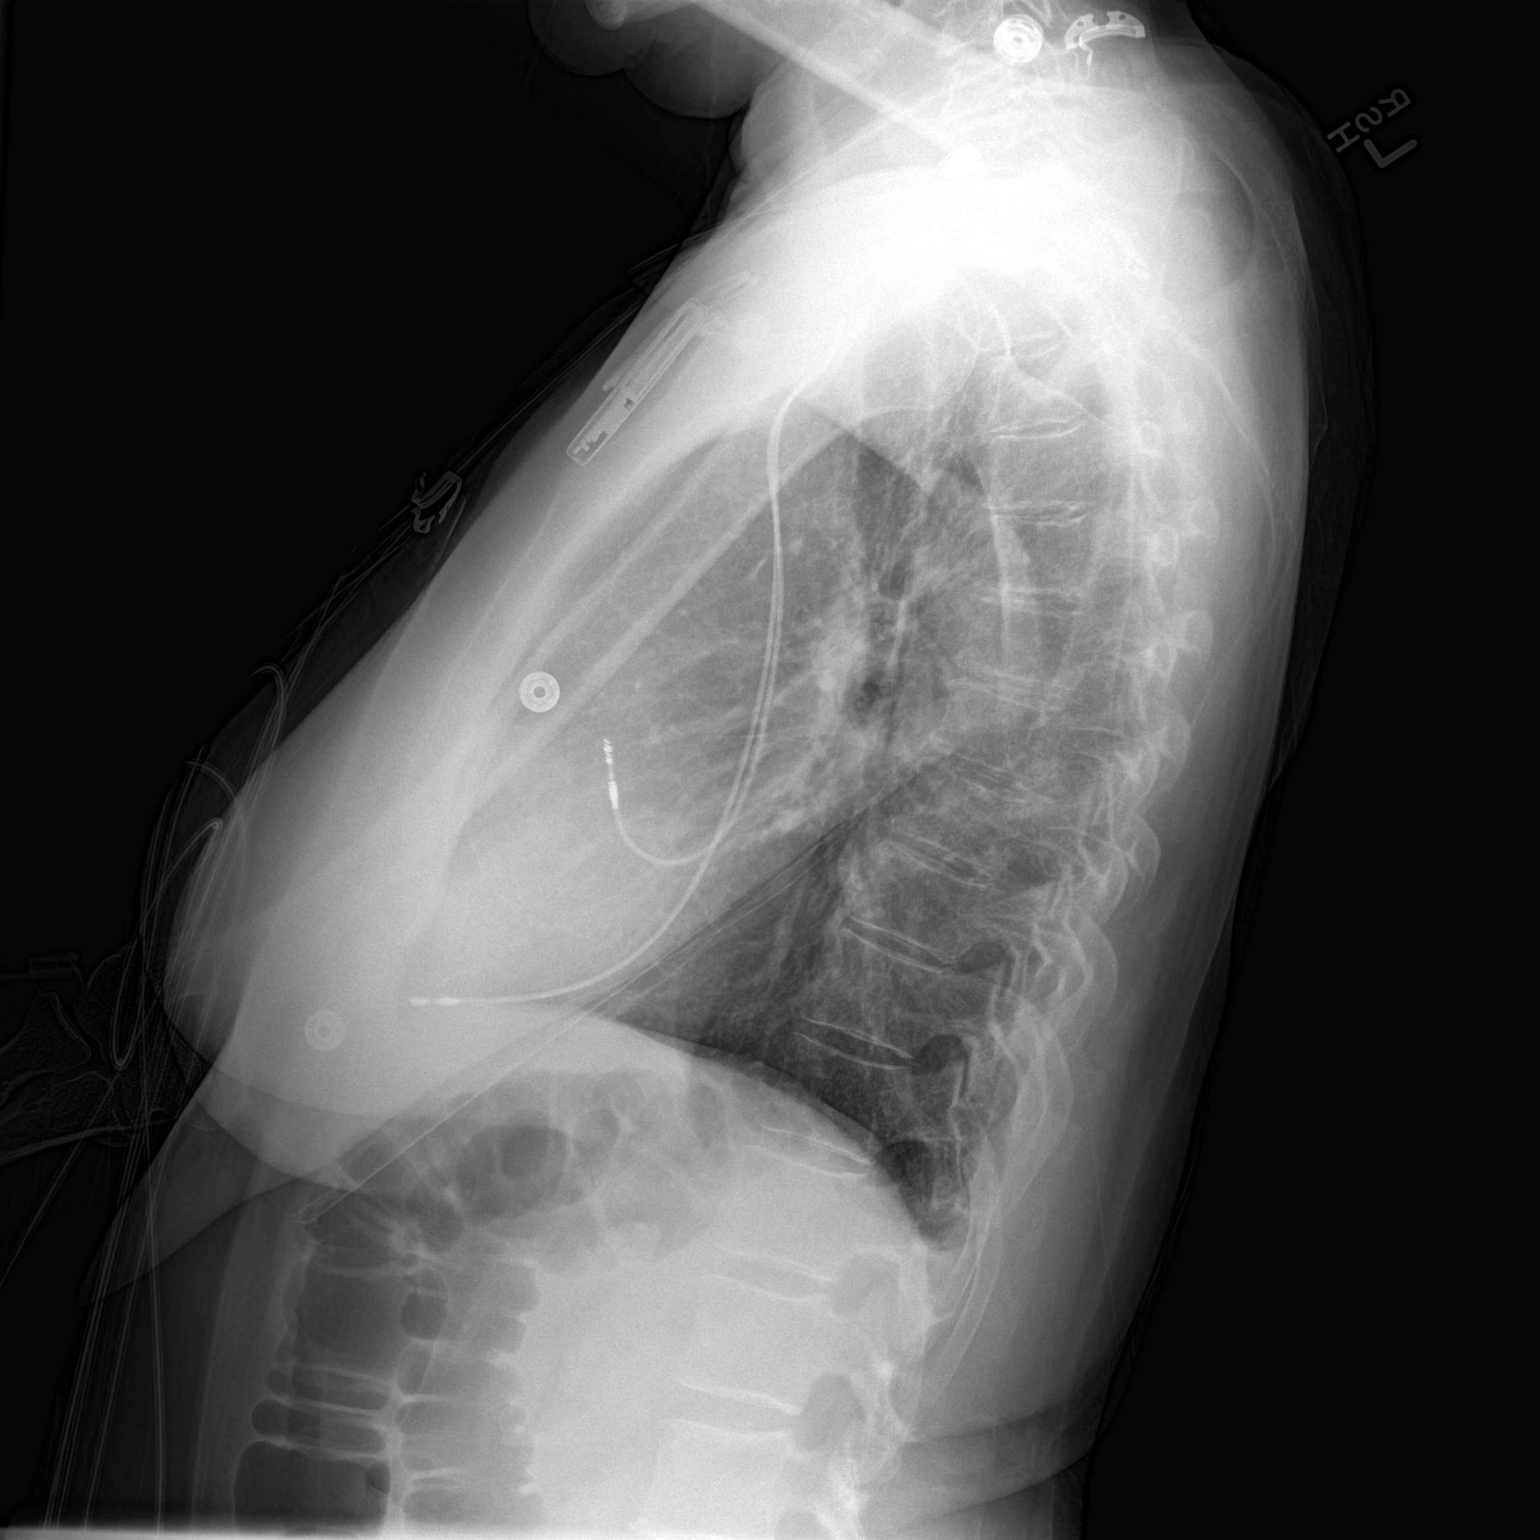

[2 of 2 positions shown; findings below may reference images not displayed]

FINDINGS: Left subclavian dual lead pacemaker in satisfactory position. No
pneumothorax.

Heart size normal.  Negative for heart failure.  Lungs are clear.
IMPRESSION: Satisfactory transvenous pacemaker placement. No active
cardiopulmonary abnormality.

## 2015-11-30 ENCOUNTER — Ambulatory Visit (INDEPENDENT_AMBULATORY_CARE_PROVIDER_SITE_OTHER): Payer: Medicare Other | Admitting: *Deleted

## 2015-11-30 DIAGNOSIS — I495 Sick sinus syndrome: Secondary | ICD-10-CM

## 2015-11-30 NOTE — Progress Notes (Signed)
Remote pacemaker transmission.   

## 2015-12-06 LAB — CUP PACEART REMOTE DEVICE CHECK
Battery Remaining Longevity: 133 mo
Battery Remaining Percentage: 95.5 %
Battery Voltage: 3.02 V
Brady Statistic AP VP Percent: 1 %
Brady Statistic AP VS Percent: 13 %
Brady Statistic AS VP Percent: 1 %
Brady Statistic AS VS Percent: 86 %
Brady Statistic RA Percent Paced: 13 %
Brady Statistic RV Percent Paced: 1 %
Implantable Lead Implant Date: 20160106
Implantable Lead Implant Date: 20160106
Implantable Lead Location: 753859
Lead Channel Impedance Value: 410 Ohm
Lead Channel Pacing Threshold Amplitude: 0.5 V
Lead Channel Pacing Threshold Pulse Width: 0.4 ms
Lead Channel Sensing Intrinsic Amplitude: 12 mV
Lead Channel Sensing Intrinsic Amplitude: 2 mV
Lead Channel Setting Pacing Amplitude: 0.75 V
Lead Channel Setting Pacing Amplitude: 2 V
Lead Channel Setting Sensing Sensitivity: 2 mV
MDC IDC LEAD LOCATION: 753860
MDC IDC MSMT LEADCHNL RV IMPEDANCE VALUE: 630 Ohm
MDC IDC PG SERIAL: 7705987
MDC IDC SESS DTM: 20170613060016
MDC IDC SET LEADCHNL RV PACING PULSEWIDTH: 0.4 ms

## 2015-12-08 ENCOUNTER — Encounter: Payer: Self-pay | Admitting: Cardiology

## 2016-02-29 ENCOUNTER — Ambulatory Visit (INDEPENDENT_AMBULATORY_CARE_PROVIDER_SITE_OTHER): Payer: Medicare Other | Admitting: *Deleted

## 2016-02-29 DIAGNOSIS — I495 Sick sinus syndrome: Secondary | ICD-10-CM

## 2016-02-29 NOTE — Progress Notes (Signed)
Remote pacemaker transmission.   

## 2016-03-02 ENCOUNTER — Encounter: Payer: Self-pay | Admitting: Cardiology

## 2016-03-02 DIAGNOSIS — H1045 Other chronic allergic conjunctivitis: Secondary | ICD-10-CM | POA: Diagnosis not present

## 2016-03-02 DIAGNOSIS — H04123 Dry eye syndrome of bilateral lacrimal glands: Secondary | ICD-10-CM | POA: Diagnosis not present

## 2016-03-10 LAB — CUP PACEART REMOTE DEVICE CHECK
Battery Remaining Longevity: 133 mo
Battery Remaining Percentage: 95.5 %
Brady Statistic AP VS Percent: 16 %
Brady Statistic AS VP Percent: 1 %
Brady Statistic AS VS Percent: 83 %
Implantable Lead Implant Date: 20160106
Implantable Lead Location: 753859
Lead Channel Impedance Value: 410 Ohm
Lead Channel Pacing Threshold Amplitude: 0.625 V
Lead Channel Pacing Threshold Pulse Width: 0.4 ms
Lead Channel Sensing Intrinsic Amplitude: 2.2 mV
Lead Channel Setting Pacing Amplitude: 0.875
Lead Channel Setting Pacing Pulse Width: 0.4 ms
MDC IDC LEAD IMPLANT DT: 20160106
MDC IDC LEAD LOCATION: 753860
MDC IDC MSMT BATTERY VOLTAGE: 3.02 V
MDC IDC MSMT LEADCHNL RA PACING THRESHOLD AMPLITUDE: 0.5 V
MDC IDC MSMT LEADCHNL RA PACING THRESHOLD PULSEWIDTH: 0.4 ms
MDC IDC MSMT LEADCHNL RV IMPEDANCE VALUE: 600 Ohm
MDC IDC MSMT LEADCHNL RV SENSING INTR AMPL: 12 mV
MDC IDC SESS DTM: 20170911223025
MDC IDC SET LEADCHNL RA PACING AMPLITUDE: 2 V
MDC IDC SET LEADCHNL RV SENSING SENSITIVITY: 2 mV
MDC IDC STAT BRADY AP VP PERCENT: 1 %
MDC IDC STAT BRADY RA PERCENT PACED: 16 %
MDC IDC STAT BRADY RV PERCENT PACED: 1 %
Pulse Gen Model: 2240
Pulse Gen Serial Number: 7705987

## 2016-03-30 DIAGNOSIS — E279 Disorder of adrenal gland, unspecified: Secondary | ICD-10-CM | POA: Diagnosis not present

## 2016-03-30 DIAGNOSIS — E039 Hypothyroidism, unspecified: Secondary | ICD-10-CM | POA: Diagnosis not present

## 2016-03-30 DIAGNOSIS — E8840 Mitochondrial metabolism disorder, unspecified: Secondary | ICD-10-CM | POA: Diagnosis not present

## 2016-03-30 DIAGNOSIS — R5383 Other fatigue: Secondary | ICD-10-CM | POA: Diagnosis not present

## 2016-03-30 DIAGNOSIS — E7800 Pure hypercholesterolemia, unspecified: Secondary | ICD-10-CM | POA: Diagnosis not present

## 2016-03-30 DIAGNOSIS — E509 Vitamin A deficiency, unspecified: Secondary | ICD-10-CM | POA: Diagnosis not present

## 2016-03-30 DIAGNOSIS — R799 Abnormal finding of blood chemistry, unspecified: Secondary | ICD-10-CM | POA: Diagnosis not present

## 2016-03-30 DIAGNOSIS — E7210 Disorders of sulfur-bearing amino-acid metabolism, unspecified: Secondary | ICD-10-CM | POA: Diagnosis not present

## 2016-04-07 ENCOUNTER — Telehealth: Payer: Self-pay

## 2016-04-07 ENCOUNTER — Encounter: Payer: Self-pay | Admitting: Family Medicine

## 2016-04-07 ENCOUNTER — Ambulatory Visit (INDEPENDENT_AMBULATORY_CARE_PROVIDER_SITE_OTHER): Payer: Medicare Other | Admitting: Family Medicine

## 2016-04-07 ENCOUNTER — Ambulatory Visit (HOSPITAL_COMMUNITY): Payer: Medicare Other

## 2016-04-07 VITALS — BP 110/72 | HR 65 | Temp 98.2°F | Resp 18 | Ht 63.0 in | Wt 165.4 lb

## 2016-04-07 DIAGNOSIS — N368 Other specified disorders of urethra: Secondary | ICD-10-CM | POA: Diagnosis not present

## 2016-04-07 DIAGNOSIS — Q6470 Unspecified congenital malformation of bladder and urethra: Secondary | ICD-10-CM

## 2016-04-07 DIAGNOSIS — N950 Postmenopausal bleeding: Secondary | ICD-10-CM

## 2016-04-07 DIAGNOSIS — R350 Frequency of micturition: Secondary | ICD-10-CM | POA: Diagnosis not present

## 2016-04-07 DIAGNOSIS — N760 Acute vaginitis: Secondary | ICD-10-CM | POA: Diagnosis not present

## 2016-04-07 DIAGNOSIS — R310 Gross hematuria: Secondary | ICD-10-CM | POA: Diagnosis not present

## 2016-04-07 LAB — POCT URINALYSIS DIP (MANUAL ENTRY)
BILIRUBIN UA: NEGATIVE
Bilirubin, UA: NEGATIVE
Glucose, UA: NEGATIVE
Nitrite, UA: NEGATIVE
PROTEIN UA: NEGATIVE
SPEC GRAV UA: 1.015
Urobilinogen, UA: 0.2
pH, UA: 5.5

## 2016-04-07 LAB — POC MICROSCOPIC URINALYSIS (UMFC): MUCUS RE: ABSENT

## 2016-04-07 LAB — POCT WET + KOH PREP
TRICH BY WET PREP: ABSENT
Yeast by KOH: ABSENT
Yeast by wet prep: ABSENT

## 2016-04-07 NOTE — Progress Notes (Addendum)
By signing my name below, I, [name], attest that this documentation has been prepared under the direction and in the presence of Mary SorensonEva Ruthel Martine, MD.  Electronically Signed: Arvilla MarketMesha Day, Medical Scribe. 04/07/16. 9:35 AM.  Subjective:    Patient ID: Mary FinnerPatricia Day, female    DOB: Jul 12, 1945, 70 y.o.   MRN: [id number]  HPI Chief Complaint  Patient presents with  . Vaginal Bleeding    Noticed pressure in vaginal area last night and saw blood.     HPI Comments: Mary Finneratricia Lavoy is a 70 y.o. female who presents to the [facility] complaining of vaginal bleeding onset last night. Pt noticed a "fullness" in her vaginal area with "extra urination" yesterday. This morning she noticed a protruding red mass, had discomfort after wiping herself after urination, and noticed red streaks. Pt went through menopause 10-12 years ago and she hasn't had any menstrual  problems afterwards. Pt's last pap smear was about 10 years ago and she's never had any precancerous, or abnormal paps during her annual visits; pt hasn't been back to a GYN since then. Pt was constipated and suspect its due to eating chili. Pt states the constipation wasn't " on going or anything to be alarmed about". Pt states after having a bad fall 2 years ago, she felt like "things were moved around". Pt has never had children and is not sexually active. Pt denies vaginal pain, vaginal bulging, blood in her undergarments, hot flashes, diaphoresis, and abdominal distension.  Patient Active Problem List   Diagnosis Date Noted  . Pacemaker 10/06/2014  . Distal radius fracture 06/29/2014  . Acute respiratory failure with hypoxia (HCC) 06/29/2014  . Fall at home 06/25/2014  . Wrist fracture 06/25/2014  . Traumatic subdural hematoma (HCC) 06/25/2014  . Multiple trauma 06/24/2014  . Syncope 06/24/2014  . Asystole- captured 16.6 sec pause on tele 06/24/2014  . Sinus node dysfunction (HCC)    Past Medical History:    Diagnosis Date  . Dysrhythmia    had 16 sec. of asystole that lead to pacemaker placement  . Head injury, acute, with loss of consciousness (HCC)   . History of syncope   . Sinus node dysfunction (HCC)    extrinsic  . Vaso vagal episode    Past Surgical History:  Procedure Laterality Date  . OPEN REDUCTION INTERNAL FIXATION (ORIF) DISTAL RADIAL FRACTURE Right 06/29/2014   Procedure: OPEN REDUCTION INTERNAL FIXATION (ORIF)  RIGHT DISTAL RADIAL FRACTURE;  Surgeon: Betha LoaKevin Kuzma, MD;  Location: [facility] OR;  Service: Orthopedics;  Laterality: Right;  . PERMANENT PACEMAKER INSERTION N/A 06/24/2014   Procedure: PERMANENT PACEMAKER INSERTION;  Surgeon: Marinus MawGregg W Taylor, MD;  [facility];  Service: Cardiovascular;  Laterality: N/A;   No Known Allergies Prior to Admission medications   Medication Sig Start Date End Date Taking? Authorizing Provider  Ascorbic Acid (VITAMIN C PO) Take 1 capsule by mouth daily as needed (Vitamin supplement).    Yes Historical Provider, MD  Calcium-Magnesium-Vitamin D (CALCIUM MAGNESIUM PO) Take 1 capsule by mouth daily.   Yes Historical Provider, MD  Cholecalciferol (VITAMIN D3) 5000 UNITS CAPS Take 1 capsule by mouth daily.   Yes Historical Provider, MD  ibuprofen (ADVIL,MOTRIN) 200 MG tablet Take 400 mg by mouth every 6 (six) hours as needed (pain).    Yes Historical Provider, MD  Multiple Vitamin (MULTIVITAMIN) tablet Take 1 tablet by mouth daily as needed (Vitamin supplement).    Yes Historical Provider, MD  Omega-3 Fatty Acids (FISH  OIL PO) Take 900 mg by mouth daily.   Yes Historical Provider, MD  Pyridoxine HCl (VITAMIN B-6 PO) Take 1 capsule by mouth daily as needed (Vitamin supplement).    Yes Historical Provider, MD   Social History   Social History  . Marital status: Single    Spouse name: N/A  . Number of children: N/A  . Years of education: N/A   Occupational History  . Not on file.   Social History Main Topics  . Smoking status: Never Smoker   . Smokeless tobacco: Not on file  . Alcohol use Yes     Comment: WINE less than 1 glass a week  . Drug use: Unknown  . Sexual activity: Not on file   Other Topics Concern  . Not on file   Social History Narrative  . No narrative on file   Review of Systems  Constitutional: Negative for diaphoresis.  Gastrointestinal: Negative for abdominal distention.  Endocrine: Negative for heat intolerance.  Genitourinary: Positive for vaginal bleeding. Negative for decreased urine volume and vaginal pain.   Objective:  Physical Exam  Constitutional: She appears well-developed and well-nourished. No distress.  HENT:  Head: Normocephalic and atraumatic.  Eyes: Conjunctivae are normal.  Neck: Neck supple.  Cardiovascular: Normal rate.   Pulmonary/Chest: Effort normal and breath sounds normal. No respiratory distress. She has no wheezes. She has no rales.  Genitourinary: Cervix exhibits friability (but otherwise appears nl).  Genitourinary Comments: Grape like clusters measuring 5-8 mm of 3-4 friable maroon nodules surrounding urethral meatus Unable to visualized a dark blood clot protruding on the left aspect Tissues are very well defined and a well defined  extruding surrounding healthy perineum The growth does no distend into the vagina Vaginal canal with sign of mucosa atrophy and  Bimanual exam limited due to discomfort, but question of uterine enlargement and inmmoblie Adnexa nl  Neurological: She is alert.  Skin: Skin is warm and dry.  Psychiatric: She has a normal mood and affect. Her behavior is normal.  Nursing note and vitals reviewed.  BP 110/72   Pulse 65   Temp 98.2 F (36.8 C) (Oral)   Resp 18   Ht 5\' 3"  (1.6 m)   Wt 165 lb 6.4 oz (75 kg)   SpO2 100%   BMI 29.30 kg/m  Assessment & Plan:   1. Postmenopausal vaginal bleeding   2. Urethral cyst   3. Abnormality of urethral meatus   It appears that pt is having acute onset of urethral abnormality - suspect urethral  prolapse vs cyst.  I am concerned that this is strangulating due to acute presentation starting last night with pressure, urinary frequency, and bleeding.  I am worried that it will obstruct her urethral meatus as could cause urinary obstructions/acute renal failure.  Called mult gyn offices who were unable to see pt today but [facility] very graciously agreed to work pt in urgently for eval - see if she needs a foley catheter placed for over the weekend. She can recheck with me on Saturday or Tuesday morning if needed.  Orders Placed This Encounter  Procedures  . US Transvaginal Non-OB    Standing Status:   Future    Standing Expiration Date:   06/07/2017    Order Specific Question:   Reason for Exam (SYMPTOM  OR DIAGNOSIS REQUIRED)    Answer:   question of uterine enlargement, post-menopausal vaginal bleeding    Order Specific Question:   Preferred imaging location?  Answer:   [facility]  . US Pelvis Complete    Standing Status:   Future    Standing Expiration Date:   06/07/2017    Order Specific Question:   Reason for Exam (SYMPTOM  OR DIAGNOSIS REQUIRED)    Answer:   question of uterine enlargement, post-menopausal vaginal bleeding    Order Specific Question:   Preferred imaging location?    Answer:   [facility]  . Ambulatory referral to Gynecology    Referral Priority:   Urgent    Referral Type:   Consultation    Referral Reason:   Specialty Services Required    Requested Specialty:   Gynecology    Number of Visits Requested:   1  . POCT Wet + KOH Prep  . POCT urinalysis dipstick  . POCT Microscopic Urinalysis ([facility])   Over 40 min spent in face-to-face evaluation of and consultation with patient and coordination of care.  Over 50% of this time was spent counseling this patient.  I personally performed the services described in this documentation, which was scribed in my presence. The recorded information has been reviewed and considered, and addended by me as  needed.   [name], M.D.  [facility] [address] [place], [place] [address] [phone] phone [phone] fax  04/07/16 10:30 AM

## 2016-04-07 NOTE — Patient Instructions (Addendum)
  Please go over to [facility] to fill out papers and see Dr.    Clayburn PertIF you received an x-ray today, you will receive an invoice from [facility]. Please contact [facility] at [phone] with questions or concerns regarding your invoice.   IF you received labwork today, you will receive an invoice from [facility]. Please contact [facility] at [phone] with questions or concerns regarding your invoice.   Our billing staff will not be able to assist you with questions regarding bills from these companies.  You will be contacted with the lab results as soon as they are available. The fastest way to get your results is to activate your My Chart account. Instructions are located on the last page of this paperwork. If you have not heard from us regarding the results in 2 weeks, please contact this office.

## 2016-04-07 NOTE — Telephone Encounter (Signed)
Referrals called and stated that they were calling patient for US and she stated that Urology said she was fine, so patient decided not to have the US.  I reported this to Dr. Clelia CroftShaw who stated that was fine she would refer patient to gyn doctor.

## 2016-04-11 ENCOUNTER — Ambulatory Visit (HOSPITAL_COMMUNITY)
Admission: RE | Admit: 2016-04-11 | Discharge: 2016-04-11 | Disposition: A | Discharge status: Home / Self Care | Payer: Medicare Other | Source: Ambulatory Visit | Attending: Family Medicine | Admitting: Family Medicine

## 2016-04-11 DIAGNOSIS — R938 Abnormal findings on diagnostic imaging of other specified body structures: Secondary | ICD-10-CM | POA: Diagnosis not present

## 2016-04-11 DIAGNOSIS — N950 Postmenopausal bleeding: Secondary | ICD-10-CM | POA: Diagnosis not present

## 2016-04-11 LAB — PAP IG, CT-NG, RFX HPV ASCU
Chlamydia Probe Amp: NOT DETECTED
GC Probe Amp: NOT DETECTED

## 2016-04-14 ENCOUNTER — Ambulatory Visit: Payer: Medicare Other | Admitting: Gynecology

## 2016-04-27 ENCOUNTER — Encounter: Payer: Self-pay | Admitting: Gynecology

## 2016-04-27 ENCOUNTER — Ambulatory Visit (INDEPENDENT_AMBULATORY_CARE_PROVIDER_SITE_OTHER): Payer: Medicare Other | Admitting: Gynecology

## 2016-04-27 VITALS — BP 122/78 | Ht 63.0 in | Wt 169.0 lb

## 2016-04-27 DIAGNOSIS — N950 Postmenopausal bleeding: Secondary | ICD-10-CM | POA: Diagnosis not present

## 2016-04-27 DIAGNOSIS — N952 Postmenopausal atrophic vaginitis: Secondary | ICD-10-CM | POA: Diagnosis not present

## 2016-04-27 NOTE — Progress Notes (Signed)
[name] July 18, 1945 [id number]        70 y.o.  G0P0000 new patient referred due to an episode of vaginal spotting with wiping 1 incident. Was evaluated by her primary physician and ultimate urologist with diagnosis of prolapsed urethra. Has been started on vaginal estrogen cream and notes improvement to her self-exam. She is due to see the urologist back in a month. Had transvaginal ultrasound ordered which showed normal-appearing uterus and ovaries.  Endometrial echo measured 5 mm with no overt pathology noted. Has not had a GYN exam in years. Several issues noted below  Past medical history,surgical history, problem list, medications, allergies, family history and social history were all reviewed and documented as reviewed in the EPIC chart.  ROS:  Performed with pertinent positives and negatives included in the history, assessment and plan.   Additional significant findings :  None   Exam: Kennon PortelaKim Gardner assistant Vitals:   04/27/16 1533  BP: 122/78  Weight: 169 lb (76.7 kg)  Height: 5\' 3"  (1.6 m)   Body mass index is 29.94 kg/m.  General appearance:  Normal affect, orientation and appearance. Skin: Grossly normal HEENT: Without gross lesions.  No cervical or supraclavicular adenopathy. Thyroid normal.  Lungs:  Clear without wheezing, rales or rhonchi Cardiac: RR, without RMG Abdominal:  Soft, nontender, without masses, guarding, rebound, organomegaly or hernia Breasts:  Examined lying and sitting without masses, retractions, discharge or axillary adenopathy. Pelvic:  Ext, BUS, Vagina with atrophic changes. No significant urethral prolapse noted at this time  Cervix with atrophic changes  Uterus axial to anteverted, normal size, shape and contour, midline and mobile nontender   Adnexa without masses or tenderness    Anus and perineum normal   Rectovaginal normal sphincter tone without palpated masses or tenderness.    Assessment/Plan:  70 y.o. G0P0000 female with  following issues:  1. Vaginal staining with wiping 1 episode. Attribute able to urethral prolapse which apparently is resolving now. Ultrasound shows endometrial echo at 5 mm with no overt pathologies like echogenic focus to suggest polyp. I reviewed with the patient all issue post menopausal bleeding and that we do not want to attribute it to some other cause and it be uterine in etiology like hyperplasia/cancer. I reviewed ACOG guidelines with 4 mm thickness and below extremely rare possibility of significant pathology. At 5 mm it is technically above the 4 mm cut off but certainly thin without overt evidence of pathology. Options for management were reviewed to include expectant management with re-presentation if any further bleeding attributed to urethral prolapse at this time, follow up ultrasound in 3 months to really look at the endometrial echo report any intervening bleeding, endometrial sampling now to include endometrial biopsy and lastly sonohysterogram now to revisualize the endometrial cavity under expansion and sampling. The pros and cons of each approach discussed to include over evaluation versus missing pathology. At this point the patient feels very comfortable with follow up ultrasound in 3 months with reporting any intervening bleeding. She understands the issues of missed pathology and is comfortable with this approach. 2. Mammography 2008. Has been doing thermograms through Dr. Martyn EhrichVaughn's office. I reviewed current screening guidelines and recommendations for annual mammography. I reviewed that thermography is not to replace mammography for routine screening and is less well studied from a large population based screening standpoint. The issues of missed pathology reviewed. Patient understands the issues but declines scheduling a mammogram. 3. Colonoscopy never. I reviewed colon cancer is a second most common  cancer in women and recommendations for screening colonoscopy. The patient states  because of this recent episode she will call and schedule a colonoscopy. 4. DEXA 2003. I recommended she schedule a screening DEXA and she is going to discuss this and follow up with Dr. Alessandra BevelsVaughn. 5. Pap smear 2017 by Dr. Clelia CroftShaw. No history of significant Pap smear abnormalities in the past. 6. Health maintenance. Patient will continue to follow up with Dr. Alessandra BevelsVaughn and Dr. Clelia CroftShaw for routine health maintenance.   [name],Aaren Krog P MD, 4:24 PM 04/27/2016

## 2016-04-27 NOTE — Patient Instructions (Signed)
Follow up for ultrasound in 3 months 

## 2016-05-02 DIAGNOSIS — N951 Menopausal and female climacteric states: Secondary | ICD-10-CM | POA: Diagnosis not present

## 2016-05-02 DIAGNOSIS — E509 Vitamin A deficiency, unspecified: Secondary | ICD-10-CM | POA: Diagnosis not present

## 2016-05-19 DIAGNOSIS — R310 Gross hematuria: Secondary | ICD-10-CM | POA: Diagnosis not present

## 2016-05-30 ENCOUNTER — Ambulatory Visit (INDEPENDENT_AMBULATORY_CARE_PROVIDER_SITE_OTHER): Payer: Medicare Other | Admitting: *Deleted

## 2016-05-30 DIAGNOSIS — I495 Sick sinus syndrome: Secondary | ICD-10-CM

## 2016-05-30 NOTE — Progress Notes (Signed)
Remote pacemaker transmission.   

## 2016-06-07 ENCOUNTER — Encounter: Payer: Self-pay | Admitting: Cardiology

## 2016-06-22 LAB — CUP PACEART REMOTE DEVICE CHECK
Battery Remaining Longevity: 133 mo
Battery Remaining Percentage: 95.5 %
Battery Voltage: 3.02 V
Brady Statistic AP VS Percent: 16 %
Brady Statistic RA Percent Paced: 16 %
Brady Statistic RV Percent Paced: 1 %
Date Time Interrogation Session: 20171212070014
Implantable Lead Implant Date: 20160106
Implantable Lead Location: 753859
Implantable Lead Location: 753860
Implantable Pulse Generator Implant Date: 20160106
Lead Channel Impedance Value: 400 Ohm
Lead Channel Pacing Threshold Amplitude: 0.5 V
Lead Channel Pacing Threshold Amplitude: 0.5 V
Lead Channel Pacing Threshold Pulse Width: 0.4 ms
Lead Channel Sensing Intrinsic Amplitude: 2.9 mV
Lead Channel Setting Sensing Sensitivity: 2 mV
MDC IDC LEAD IMPLANT DT: 20160106
MDC IDC MSMT LEADCHNL RV IMPEDANCE VALUE: 610 Ohm
MDC IDC MSMT LEADCHNL RV PACING THRESHOLD PULSEWIDTH: 0.4 ms
MDC IDC MSMT LEADCHNL RV SENSING INTR AMPL: 12 mV
MDC IDC SET LEADCHNL RA PACING AMPLITUDE: 2 V
MDC IDC SET LEADCHNL RV PACING AMPLITUDE: 0.75 V
MDC IDC SET LEADCHNL RV PACING PULSEWIDTH: 0.4 ms
MDC IDC STAT BRADY AP VP PERCENT: 1 %
MDC IDC STAT BRADY AS VP PERCENT: 1 %
MDC IDC STAT BRADY AS VS PERCENT: 84 %
Pulse Gen Model: 2240
Pulse Gen Serial Number: 7705987

## 2016-07-28 ENCOUNTER — Ambulatory Visit (INDEPENDENT_AMBULATORY_CARE_PROVIDER_SITE_OTHER): Payer: Medicare Other | Admitting: Gynecology

## 2016-07-28 ENCOUNTER — Ambulatory Visit (INDEPENDENT_AMBULATORY_CARE_PROVIDER_SITE_OTHER): Payer: Medicare Other

## 2016-07-28 ENCOUNTER — Encounter: Payer: Self-pay | Admitting: Gynecology

## 2016-07-28 VITALS — BP 120/70

## 2016-07-28 DIAGNOSIS — N950 Postmenopausal bleeding: Secondary | ICD-10-CM

## 2016-07-28 NOTE — Progress Notes (Signed)
    [name] 29-Nov-1945 [id number]        71 y.o.  G0P0000 presents for follow up ultrasound. Patient has history of vaginal spotting is wiping 1 incident. She was evaluated by her primary physician and urologist and diagnosed with urethral prolapse. She was started on estrogen cream with good response. She did have an ultrasound which showed normal uterus and ovaries with endometrial echo 5 millimeters.  We discussed various evaluation options to include endometrial sampling versus follow up ultrasound given the marginal endometrial echo. Patient elected for follow up ultrasound as noted in the 04/27/2016 note. She has done no further bleeding.  Past medical history,surgical history, problem list, medications, allergies, family history and social history were all reviewed and documented in the EPIC chart.  Directed ROS with pertinent positives and negatives documented in the history of present illness/assessment and plan.  Exam: Vitals:   07/28/16 0930  BP: 120/70   General appearance:  Normal  Ultrasound transvaginal shows uterus retroverted with sliver of fluid within the endometrium. Endometrial echo 0.8 mm. Right and left ovaries normal/postmenopausal. Cul-de-sac negative  Assessment/Plan:  71 y.o. G0P0000 with history of vaginal staining 1 attribute able to urethral prolapse. Endometrial echo measured elsewhere 5 mm. Endometrial echo measured here 0.8 mm with a sliver of fluid. I suspect that the fluid was also incorporated into the last endometrial echo. Regardless I reviewed with the patient given the thin endometrium and no further bleeding the likelihood of significant pathology extremely low. Patient comfortable with no further intervention and following expectantly with reporting of any vaginal bleeding. Patient will follow up and she is due for her annual exam.    [name],Mary Takahashi P MD, 10:19 AM 07/28/2016

## 2016-07-28 NOTE — Patient Instructions (Signed)
Follow up routinely when you're due for your annual exam. Follow up sooner if you do any vaginal bleeding.

## 2016-09-06 ENCOUNTER — Encounter: Payer: Self-pay | Admitting: Internal Medicine

## 2016-09-06 ENCOUNTER — Ambulatory Visit (INDEPENDENT_AMBULATORY_CARE_PROVIDER_SITE_OTHER): Payer: Medicare Other | Admitting: Internal Medicine

## 2016-09-06 VITALS — BP 120/78 | HR 70 | Ht 63.0 in | Wt 163.4 lb

## 2016-09-06 DIAGNOSIS — I495 Sick sinus syndrome: Secondary | ICD-10-CM

## 2016-09-06 LAB — CUP PACEART INCLINIC DEVICE CHECK
Battery Voltage: 3.01 V
Brady Statistic RV Percent Paced: 0.05 %
Implantable Lead Location: 753859
Implantable Lead Location: 753860
Implantable Pulse Generator Implant Date: 20160106
Lead Channel Impedance Value: 600 Ohm
Lead Channel Pacing Threshold Amplitude: 0.5 V
Lead Channel Pacing Threshold Pulse Width: 0.4 ms
Lead Channel Pacing Threshold Pulse Width: 0.4 ms
Lead Channel Pacing Threshold Pulse Width: 0.4 ms
Lead Channel Setting Pacing Amplitude: 0.875
Lead Channel Setting Pacing Pulse Width: 0.4 ms
Lead Channel Setting Sensing Sensitivity: 2 mV
MDC IDC LEAD IMPLANT DT: 20160106
MDC IDC LEAD IMPLANT DT: 20160106
MDC IDC MSMT LEADCHNL RA IMPEDANCE VALUE: 387.5 Ohm
MDC IDC MSMT LEADCHNL RA PACING THRESHOLD AMPLITUDE: 0.5 V
MDC IDC MSMT LEADCHNL RA SENSING INTR AMPL: 2.6 mV
MDC IDC MSMT LEADCHNL RV PACING THRESHOLD AMPLITUDE: 0.75 V
MDC IDC MSMT LEADCHNL RV PACING THRESHOLD AMPLITUDE: 0.75 V
MDC IDC MSMT LEADCHNL RV PACING THRESHOLD PULSEWIDTH: 0.4 ms
MDC IDC MSMT LEADCHNL RV SENSING INTR AMPL: 12 mV
MDC IDC PG SERIAL: 7705987
MDC IDC SESS DTM: 20180321135901
MDC IDC SET LEADCHNL RA PACING AMPLITUDE: 2 V
MDC IDC STAT BRADY RA PERCENT PACED: 15 %
Pulse Gen Model: 2240

## 2016-09-06 NOTE — Patient Instructions (Signed)
Medication Instructions:  Your physician recommends that you continue on your current medications as directed. Please refer to the Current Medication list given to you today.   Labwork: None ordered  Testing/Procedures: None ordered  Follow-Up: Remote monitoring is used to monitor your Pacemaker from home. This monitoring reduces the number of office visits required to check your device to one time per year. It allows us to keep an eye on the functioning of your device to ensure it is working properly. You are scheduled for a device check from home on 12/06/16. You may send your transmission at any time that day. If you have a wireless device, the transmission will be sent automatically. After your physician reviews your transmission, you will receive a postcard with your next transmission date.  Your physician wants you to follow-up in: 12 months with Dr. Taylor. You will receive a reminder letter in the mail two months in advance. If you don't receive a letter, please call our office to schedule the follow-up appointment.   Any Other Special Instructions Will Be Listed Below (If Applicable).     If you need a refill on your cardiac medications before your next appointment, please call your pharmacy.  

## 2016-09-06 NOTE — Progress Notes (Signed)
HPI [name] returns today for pacemaker follow-up. She is a very pleasant 71 year old woman  With a h/o symptomatic sinus node dysfunction,s/p PPM. She has done well in the interim and denies chest pain or sob. No syncope.  No Known Allergies   Current Outpatient Prescriptions  Medication Sig Dispense Refill  . Ascorbic Acid (VITAMIN C PO) Take 1 capsule by mouth daily as needed (Vitamin supplement).     . Calcium-Magnesium-Vitamin D (CALCIUM MAGNESIUM PO) Take 1 capsule by mouth daily.    . Cholecalciferol (VITAMIN D3) 5000 UNITS CAPS Take 1 capsule by mouth daily.    Marland Kitchen. estradiol (ESTRACE) 0.1 MG/GM vaginal cream Place 1 Applicatorful vaginally at bedtime.    Marland Kitchen. ibuprofen (ADVIL,MOTRIN) 200 MG tablet Take 400 mg by mouth every 6 (six) hours as needed (pain).     . Multiple Vitamin (MULTIVITAMIN) tablet Take 1 tablet by mouth daily as needed (Vitamin supplement).     . Omega-3 Fatty Acids (FISH OIL PO) Take 900 mg by mouth daily.    . Pyridoxine HCl (VITAMIN B-6 PO) Take 1 capsule by mouth daily as needed (Vitamin supplement).      No current facility-administered medications for this visit.      Past Medical History:  Diagnosis Date  . Dysrhythmia    had 16 sec. of asystole that lead to pacemaker placement  . Head injury, acute, with loss of consciousness (HCC)   . History of syncope   . Sinus node dysfunction (HCC)    extrinsic  . Vaso vagal episode     ROS:   All systems reviewed and negative except as noted in the HPI.   Past Surgical History:  Procedure Laterality Date  . OPEN REDUCTION INTERNAL FIXATION (ORIF) DISTAL RADIAL FRACTURE Right 06/29/2014   Procedure: OPEN REDUCTION INTERNAL FIXATION (ORIF)  RIGHT DISTAL RADIAL FRACTURE;  Surgeon: Betha LoaKevin Kuzma, MD;  Location: [facility] OR;  Service: Orthopedics;  Laterality: Right;  . PERMANENT PACEMAKER INSERTION N/A 06/24/2014   Procedure: PERMANENT PACEMAKER INSERTION;  Surgeon: Marinus MawGregg W Taylor, MD;  [facility];   Service: Cardiovascular;  Laterality: N/A;     Family History  Problem Relation Age of Onset  . Heart disease Father   . Breast cancer Sister     2840's  . Cancer Brother     Prostate  . Cancer Maternal Grandmother   . Heart disease Maternal Grandfather   . Stroke Paternal Grandmother      Social History   Social History  . Marital status: Single    Spouse name: N/A  . Number of children: N/A  . Years of education: N/A   Occupational History  . Not on file.   Social History Main Topics  . Smoking status: Never Smoker  . Smokeless tobacco: Never Used  . Alcohol use Yes     Comment: WINE less than 1 glass a week  . Drug use: No  . Sexual activity: Not Currently    Partners: Female     Comment: 1st intercourse 71 yo-Fewer than 5 partners   Other Topics Concern  . Not on file   Social History Narrative  . No narrative on file     BP 120/78   Pulse 70   Ht 5\' 3"  (1.6 m)   Wt 163 lb 6.4 oz (74.1 kg)   SpO2 97%   BMI 28.95 kg/m   Physical Exam:  Well appearing 71 year old woman, NAD HEENT: Unremarkable Neck:  6 cm  JVD, no thyromegally Back:  No CVA tenderness Lungs:  Clear, with no wheezes, rales, or rhonchi. Well-healed pacemaker incision. HEART:  Regular rate rhythm, no murmurs, no rubs, no clicks Abd:  soft, positive bowel sounds, no organomegally, no rebound, no guarding Ext:  2 plus pulses, no edema, no cyanosis, no clubbing Skin:  No rashes no nodules Neuro:  CN II through XII intact, motor grossly intact  EKG - normal sinus rhythm  DEVICE  Normal device function.  See PaceArt for details.   Assess/Plan: 1. Sinus node dysfunction - she is s/p PPM and doing well.  2. HTN - her blood pressure is well controlled. Will follow. 3. PPM - her device is working normally.  Will follow.  Leonia Reeves.D.

## 2016-12-06 ENCOUNTER — Ambulatory Visit (INDEPENDENT_AMBULATORY_CARE_PROVIDER_SITE_OTHER): Payer: Medicare Other | Admitting: *Deleted

## 2016-12-06 DIAGNOSIS — I495 Sick sinus syndrome: Secondary | ICD-10-CM | POA: Diagnosis not present

## 2016-12-07 NOTE — Progress Notes (Signed)
Remote pacemaker transmission.   

## 2016-12-08 ENCOUNTER — Encounter: Payer: Self-pay | Admitting: Cardiology

## 2016-12-14 DIAGNOSIS — H02834 Dermatochalasis of left upper eyelid: Secondary | ICD-10-CM | POA: Diagnosis not present

## 2016-12-14 DIAGNOSIS — H02413 Mechanical ptosis of bilateral eyelids: Secondary | ICD-10-CM | POA: Diagnosis not present

## 2016-12-14 DIAGNOSIS — H0279 Other degenerative disorders of eyelid and periocular area: Secondary | ICD-10-CM | POA: Diagnosis not present

## 2016-12-14 DIAGNOSIS — H02831 Dermatochalasis of right upper eyelid: Secondary | ICD-10-CM | POA: Diagnosis not present

## 2016-12-20 LAB — CUP PACEART REMOTE DEVICE CHECK
Battery Remaining Percentage: 95.5 %
Battery Voltage: 3.01 V
Brady Statistic AS VS Percent: 82 %
Brady Statistic RA Percent Paced: 17 %
Implantable Lead Implant Date: 20160106
Implantable Lead Implant Date: 20160106
Implantable Lead Location: 753860
Implantable Pulse Generator Implant Date: 20160106
Lead Channel Pacing Threshold Amplitude: 0.5 V
Lead Channel Pacing Threshold Amplitude: 0.625 V
Lead Channel Pacing Threshold Pulse Width: 0.4 ms
Lead Channel Pacing Threshold Pulse Width: 0.4 ms
Lead Channel Sensing Intrinsic Amplitude: 2.8 mV
Lead Channel Setting Pacing Amplitude: 0.875
Lead Channel Setting Sensing Sensitivity: 2 mV
MDC IDC LEAD LOCATION: 753859
MDC IDC MSMT BATTERY REMAINING LONGEVITY: 133 mo
MDC IDC MSMT LEADCHNL RA IMPEDANCE VALUE: 400 Ohm
MDC IDC MSMT LEADCHNL RV IMPEDANCE VALUE: 610 Ohm
MDC IDC MSMT LEADCHNL RV SENSING INTR AMPL: 12 mV
MDC IDC PG SERIAL: 7705987
MDC IDC SESS DTM: 20180620083841
MDC IDC SET LEADCHNL RA PACING AMPLITUDE: 2 V
MDC IDC SET LEADCHNL RV PACING PULSEWIDTH: 0.4 ms
MDC IDC STAT BRADY AP VP PERCENT: 1 %
MDC IDC STAT BRADY AP VS PERCENT: 17 %
MDC IDC STAT BRADY AS VP PERCENT: 1 %
MDC IDC STAT BRADY RV PERCENT PACED: 1 %

## 2017-01-05 DIAGNOSIS — H0279 Other degenerative disorders of eyelid and periocular area: Secondary | ICD-10-CM | POA: Diagnosis not present

## 2017-01-05 DIAGNOSIS — H02413 Mechanical ptosis of bilateral eyelids: Secondary | ICD-10-CM | POA: Diagnosis not present

## 2017-01-05 DIAGNOSIS — H02831 Dermatochalasis of right upper eyelid: Secondary | ICD-10-CM | POA: Diagnosis not present

## 2017-01-05 DIAGNOSIS — H02834 Dermatochalasis of left upper eyelid: Secondary | ICD-10-CM | POA: Diagnosis not present

## 2017-01-22 DIAGNOSIS — H02831 Dermatochalasis of right upper eyelid: Secondary | ICD-10-CM | POA: Diagnosis not present

## 2017-01-22 DIAGNOSIS — H02413 Mechanical ptosis of bilateral eyelids: Secondary | ICD-10-CM | POA: Diagnosis not present

## 2017-01-22 DIAGNOSIS — H02834 Dermatochalasis of left upper eyelid: Secondary | ICD-10-CM | POA: Diagnosis not present

## 2017-01-22 DIAGNOSIS — H02423 Myogenic ptosis of bilateral eyelids: Secondary | ICD-10-CM | POA: Diagnosis not present

## 2017-02-20 ENCOUNTER — Ambulatory Visit: Payer: Medicare Other | Admitting: Gynecology

## 2017-02-21 ENCOUNTER — Encounter: Payer: Self-pay | Admitting: Gynecology

## 2017-02-21 ENCOUNTER — Ambulatory Visit (INDEPENDENT_AMBULATORY_CARE_PROVIDER_SITE_OTHER): Payer: Medicare Other | Admitting: Gynecology

## 2017-02-21 VITALS — BP 120/78

## 2017-02-21 DIAGNOSIS — N950 Postmenopausal bleeding: Secondary | ICD-10-CM | POA: Diagnosis not present

## 2017-02-21 NOTE — Patient Instructions (Signed)
Follow up for ultrasound as scheduled 

## 2017-02-21 NOTE — Progress Notes (Signed)
    [name] 16-Dec-1945 [id number]        71 y.o.  G0P0000 presents with an episode of postmenopausal bleeding this past week where she had one day where she bled through her clothes. Spotted for 2 days and now has done no bleeding. No significant cramping or pain.  She has a history of vaginal spotting end of last year.  She had a ultrasound that showed an endometrial echo at 5 mm. Elected for observation with follow up ultrasound 07/2016 showing endometrial echo at 0.8 mm. She is done the bleeding until most recently.  Past medical history,surgical history, problem list, medications, allergies, family history and social history were all reviewed and documented in the EPIC chart.  Directed ROS with pertinent positives and negatives documented in the history of present illness/assessment and plan.  Exam: Kennon PortelaKim Gardner assistant Vitals:   02/21/17 1420  BP: 120/78   General appearance:  Normal Abdomen soft nontender without masses guarding rebound Pelvic external BUS vagina with atrophic changes. Cervix with atrophic changes. Uterus grossly normal midline mobile nontender. Adnexa without masses or tenderness.  Assessment/Plan:  71 y.o. G0P0000 episode of heavy vaginal bleeding 1 day. Recommend patient proceed with sonohysterogram for cavity assessment and sampling. Differential to include atrophic changes versus hyperplastic, polyp, cancer discussed. Patient will schedule an appointment and follow up with me for the sonohysterogram.   Greater than 50% of my time was spent in direct face to face counseling and coordination of care with the patient.     [name],Sterlin Knightly P MD, 2:59 PM 02/21/2017

## 2017-02-28 DIAGNOSIS — N950 Postmenopausal bleeding: Secondary | ICD-10-CM | POA: Diagnosis not present

## 2017-03-06 DIAGNOSIS — N950 Postmenopausal bleeding: Secondary | ICD-10-CM | POA: Diagnosis not present

## 2017-03-06 DIAGNOSIS — D261 Other benign neoplasm of corpus uteri: Secondary | ICD-10-CM | POA: Diagnosis not present

## 2017-03-07 ENCOUNTER — Ambulatory Visit (INDEPENDENT_AMBULATORY_CARE_PROVIDER_SITE_OTHER): Payer: Medicare Other | Admitting: *Deleted

## 2017-03-07 DIAGNOSIS — I495 Sick sinus syndrome: Secondary | ICD-10-CM | POA: Diagnosis not present

## 2017-03-07 NOTE — Progress Notes (Signed)
Remote pacemaker transmission.   

## 2017-03-08 LAB — CUP PACEART REMOTE DEVICE CHECK
Battery Remaining Longevity: 133 mo
Brady Statistic AS VP Percent: 1 %
Brady Statistic RA Percent Paced: 18 %
Date Time Interrogation Session: 20180919060031
Implantable Lead Implant Date: 20160106
Implantable Lead Location: 753860
Lead Channel Impedance Value: 410 Ohm
Lead Channel Impedance Value: 610 Ohm
Lead Channel Pacing Threshold Pulse Width: 0.4 ms
Lead Channel Sensing Intrinsic Amplitude: 12 mV
Lead Channel Setting Pacing Amplitude: 2 V
Lead Channel Setting Sensing Sensitivity: 2 mV
MDC IDC LEAD IMPLANT DT: 20160106
MDC IDC LEAD LOCATION: 753859
MDC IDC MSMT BATTERY REMAINING PERCENTAGE: 95.5 %
MDC IDC MSMT BATTERY VOLTAGE: 3.01 V
MDC IDC MSMT LEADCHNL RA PACING THRESHOLD AMPLITUDE: 0.5 V
MDC IDC MSMT LEADCHNL RA SENSING INTR AMPL: 3.2 mV
MDC IDC MSMT LEADCHNL RV PACING THRESHOLD AMPLITUDE: 0.5 V
MDC IDC MSMT LEADCHNL RV PACING THRESHOLD PULSEWIDTH: 0.4 ms
MDC IDC PG IMPLANT DT: 20160106
MDC IDC SET LEADCHNL RV PACING AMPLITUDE: 0.75 V
MDC IDC SET LEADCHNL RV PACING PULSEWIDTH: 0.4 ms
MDC IDC STAT BRADY AP VP PERCENT: 1 %
MDC IDC STAT BRADY AP VS PERCENT: 19 %
MDC IDC STAT BRADY AS VS PERCENT: 81 %
MDC IDC STAT BRADY RV PERCENT PACED: 1 %
Pulse Gen Model: 2240
Pulse Gen Serial Number: 7705987

## 2017-03-09 ENCOUNTER — Encounter: Payer: Self-pay | Admitting: Cardiology

## 2017-03-26 ENCOUNTER — Ambulatory Visit: Payer: Medicare Other | Admitting: Gynecology

## 2017-03-26 ENCOUNTER — Other Ambulatory Visit: Payer: Medicare Other

## 2017-04-11 ENCOUNTER — Encounter: Payer: Self-pay | Admitting: Family Medicine

## 2017-04-11 DIAGNOSIS — Z1211 Encounter for screening for malignant neoplasm of colon: Secondary | ICD-10-CM | POA: Diagnosis not present

## 2017-04-11 DIAGNOSIS — Z0000 Encounter for general adult medical examination without abnormal findings: Secondary | ICD-10-CM | POA: Diagnosis not present

## 2017-06-06 ENCOUNTER — Ambulatory Visit (INDEPENDENT_AMBULATORY_CARE_PROVIDER_SITE_OTHER): Payer: Medicare Other | Admitting: *Deleted

## 2017-06-06 DIAGNOSIS — I495 Sick sinus syndrome: Secondary | ICD-10-CM | POA: Diagnosis not present

## 2017-06-06 NOTE — Progress Notes (Signed)
Remote pacemaker transmission.   

## 2017-06-07 ENCOUNTER — Encounter: Payer: Self-pay | Admitting: Cardiology

## 2017-06-08 LAB — CUP PACEART REMOTE DEVICE CHECK
Battery Voltage: 3.01 V
Brady Statistic AP VP Percent: 1 %
Brady Statistic AS VP Percent: 1 %
Brady Statistic AS VS Percent: 83 %
Brady Statistic RA Percent Paced: 17 %
Implantable Lead Implant Date: 20160106
Implantable Lead Location: 753860
Implantable Pulse Generator Implant Date: 20160106
Lead Channel Impedance Value: 580 Ohm
Lead Channel Pacing Threshold Amplitude: 0.5 V
Lead Channel Pacing Threshold Pulse Width: 0.4 ms
Lead Channel Sensing Intrinsic Amplitude: 12 mV
Lead Channel Setting Pacing Amplitude: 2 V
Lead Channel Setting Sensing Sensitivity: 2 mV
MDC IDC LEAD IMPLANT DT: 20160106
MDC IDC LEAD LOCATION: 753859
MDC IDC MSMT BATTERY REMAINING LONGEVITY: 133 mo
MDC IDC MSMT BATTERY REMAINING PERCENTAGE: 95.5 %
MDC IDC MSMT LEADCHNL RA IMPEDANCE VALUE: 390 Ohm
MDC IDC MSMT LEADCHNL RA SENSING INTR AMPL: 2.9 mV
MDC IDC MSMT LEADCHNL RV PACING THRESHOLD AMPLITUDE: 0.5 V
MDC IDC MSMT LEADCHNL RV PACING THRESHOLD PULSEWIDTH: 0.4 ms
MDC IDC SESS DTM: 20181219070015
MDC IDC SET LEADCHNL RV PACING AMPLITUDE: 0.75 V
MDC IDC SET LEADCHNL RV PACING PULSEWIDTH: 0.4 ms
MDC IDC STAT BRADY AP VS PERCENT: 17 %
MDC IDC STAT BRADY RV PERCENT PACED: 1 %
Pulse Gen Model: 2240
Pulse Gen Serial Number: 7705987

## 2017-09-05 ENCOUNTER — Ambulatory Visit (INDEPENDENT_AMBULATORY_CARE_PROVIDER_SITE_OTHER): Payer: Medicare Other | Admitting: *Deleted

## 2017-09-05 DIAGNOSIS — I495 Sick sinus syndrome: Secondary | ICD-10-CM | POA: Diagnosis not present

## 2017-09-05 NOTE — Progress Notes (Signed)
Remote pacemaker transmission.   

## 2017-09-06 ENCOUNTER — Encounter: Payer: Self-pay | Admitting: Cardiology

## 2017-09-07 LAB — CUP PACEART REMOTE DEVICE CHECK
Battery Remaining Percentage: 95.5 %
Brady Statistic AP VS Percent: 15 %
Brady Statistic RV Percent Paced: 1 %
Date Time Interrogation Session: 20190320064135
Implantable Lead Implant Date: 20160106
Implantable Lead Location: 753859
Lead Channel Impedance Value: 580 Ohm
Lead Channel Pacing Threshold Amplitude: 0.5 V
Lead Channel Sensing Intrinsic Amplitude: 12 mV
Lead Channel Setting Pacing Amplitude: 2 V
Lead Channel Setting Pacing Pulse Width: 0.4 ms
Lead Channel Setting Sensing Sensitivity: 2 mV
MDC IDC LEAD IMPLANT DT: 20160106
MDC IDC LEAD LOCATION: 753860
MDC IDC MSMT BATTERY REMAINING LONGEVITY: 133 mo
MDC IDC MSMT BATTERY VOLTAGE: 3.01 V
MDC IDC MSMT LEADCHNL RA IMPEDANCE VALUE: 410 Ohm
MDC IDC MSMT LEADCHNL RA PACING THRESHOLD AMPLITUDE: 0.5 V
MDC IDC MSMT LEADCHNL RA PACING THRESHOLD PULSEWIDTH: 0.4 ms
MDC IDC MSMT LEADCHNL RA SENSING INTR AMPL: 2.2 mV
MDC IDC MSMT LEADCHNL RV PACING THRESHOLD PULSEWIDTH: 0.4 ms
MDC IDC PG IMPLANT DT: 20160106
MDC IDC SET LEADCHNL RV PACING AMPLITUDE: 0.75 V
MDC IDC STAT BRADY AP VP PERCENT: 1 %
MDC IDC STAT BRADY AS VP PERCENT: 1 %
MDC IDC STAT BRADY AS VS PERCENT: 85 %
MDC IDC STAT BRADY RA PERCENT PACED: 15 %
Pulse Gen Model: 2240
Pulse Gen Serial Number: 7705987

## 2017-09-14 ENCOUNTER — Encounter: Payer: Self-pay | Admitting: Physician Assistant

## 2017-09-14 ENCOUNTER — Other Ambulatory Visit: Payer: Self-pay

## 2017-09-14 ENCOUNTER — Ambulatory Visit: Payer: Medicare Other | Admitting: Physician Assistant

## 2017-09-14 VITALS — BP 131/78 | HR 70 | Temp 97.7°F | Resp 16 | Ht 62.5 in | Wt 168.8 lb

## 2017-09-14 DIAGNOSIS — H00014 Hordeolum externum left upper eyelid: Secondary | ICD-10-CM | POA: Diagnosis not present

## 2017-09-14 MED ORDER — BACITRACIN 500 UNIT/GM EX OINT: 1.0000 "application " | TOPICAL_OINTMENT | Freq: Every day | CUTANEOUS | 0 refills | Status: DC

## 2017-09-14 MED ORDER — DOXYCYCLINE HYCLATE 50 MG PO CAPS: 50.0000 mg | ORAL_CAPSULE | Freq: Two times a day (BID) | ORAL | 0 refills | Status: DC

## 2017-09-14 NOTE — Progress Notes (Signed)
[name]  MRN: [id number] DOB: June 09, 1946  PCP: Patient, No Pcp Per  Subjective:  Pt is a 72 year old female who presents to clinic for eye redness. C/o left eye lid swelling and redness x > 1 week. She has been applying warm compress to the area for one week. Denies drainage.   Review of Systems  Constitutional: Negative for chills and fever.  Eyes: Negative for photophobia, pain, discharge, redness, itching and visual disturbance.  Skin: Positive for wound.    Patient Active Problem List   Diagnosis Date Noted  . Pacemaker 10/06/2014  . Distal radius fracture 06/29/2014  . Acute respiratory failure with hypoxia (HCC) 06/29/2014  . Fall at home 06/25/2014  . Wrist fracture 06/25/2014  . Traumatic subdural hematoma (HCC) 06/25/2014  . Multiple trauma 06/24/2014  . Syncope 06/24/2014  . Asystole- captured 16.6 sec pause on tele 06/24/2014  . Sinus node dysfunction (HCC)     Current Outpatient Medications on File Prior to Visit  Medication Sig Dispense Refill  . Ascorbic Acid (VITAMIN C PO) Take 1 capsule by mouth daily as needed (Vitamin supplement).     . Calcium-Magnesium-Vitamin D (CALCIUM MAGNESIUM PO) Take 1 capsule by mouth daily.    . Cholecalciferol (VITAMIN D3) 5000 UNITS CAPS Take 1 capsule by mouth daily.    Marland Kitchen ibuprofen (ADVIL,MOTRIN) 200 MG tablet Take 400 mg by mouth every 6 (six) hours as needed (pain).     . Multiple Vitamin (MULTIVITAMIN) tablet Take 1 tablet by mouth daily as needed (Vitamin supplement).     . Omega-3 Fatty Acids (FISH OIL PO) Take 900 mg by mouth daily.    Marland Kitchen estradiol (ESTRACE) 0.1 MG/GM vaginal cream Place 1 Applicatorful vaginally at bedtime.    . Pyridoxine HCl (VITAMIN B-6 PO) Take 1 capsule by mouth daily as needed (Vitamin supplement).      No current facility-administered medications on file prior to visit.     No Known Allergies   Objective:  BP 131/78   Pulse 70   Temp 97.7 F (36.5 C) (Oral)   Resp 16   Ht 5' 2.5"  (1.588 m)   Wt 168 lb 12.8 oz (76.6 kg)   SpO2 98%   BMI 30.38 kg/m   Physical Exam  Constitutional: She is oriented to person, place, and time and well-developed, well-nourished, and in no distress. No distress.  HENT:  Right Ear: Tympanic membrane normal.  Left Ear: Tympanic membrane normal.  Nose: Mucosal edema present. No rhinorrhea. Right sinus exhibits no maxillary sinus tenderness and no frontal sinus tenderness. Left sinus exhibits no maxillary sinus tenderness and no frontal sinus tenderness.  Mouth/Throat: Oropharynx is clear and moist and mucous membranes are normal.  Eyes: Pupils are equal, round, and reactive to light. Conjunctivae and EOM are normal. Right eye exhibits no hordeolum. Left eye exhibits hordeolum. Left eye exhibits no discharge and no exudate.    Cardiovascular: Normal rate, regular rhythm and normal heart sounds.  Pulmonary/Chest: Effort normal and breath sounds normal. No respiratory distress. She has no wheezes. She has no rales.  Neurological: She is alert and oriented to person, place, and time. GCS score is 15.  Skin: Skin is warm and dry.  Psychiatric: Mood, memory, affect and judgment normal.  Vitals reviewed.   Assessment and Plan :  1. Hordeolum of left upper eyelid, unspecified hordeolum type - bacitracin 500 UNIT/GM ointment; Apply 1 application topically at bedtime.  Dispense: 15 g; Refill: 0 - doxycycline (VIBRAMYCIN) 50  MG capsule; Take 1 capsule (50 mg total) by mouth 2 (two) times daily. Decrease dose once you see improvement to doxycycline 50mg  daily for 2-3 wks.  Dispense: 35 capsule; Refill: 0 - Ambulatory referral to Ophthalmology - Pt presents with worsening eye redness x > 1 week refractory to warm compress. Plan to treat with topical and oral antibiotic. Referral placed to ophthalmology for eval and possible I&D. She understands and agrees with plan.   Marco CollieWhitney Wyatt Thorstenson, PA-C  [facility] 09/14/2017  12:18 PM

## 2017-09-14 NOTE — Patient Instructions (Addendum)
Bacitracin - Antibiotic ointment is placed directly onto the lid margin once daily at bedtime. Once symptoms improve (generally one to two weeks), treatment can be stopped, but lid hygiene measures should be continued.  Doxycycline - Start taking doxycycline 50 mg twice daily then taper the dose once you notice improvement to doxycycline 50 mg once a day.   Lid washing - Either warm water or very dilute baby shampoo can be placed on a clean wash cloth, gauze pad, or cotton swab. The patient should then be advised to gently clean along the lashes and lid margin to remove the accumulated material with care to avoid contacting the ocular surface. If shampoo is used, thorough rinsing is recommended. Vigorous washing should be avoided, as it may cause more irritation.   You will receive a phone call to schedule an appointment with opthalmology.  Blepharitis Blepharitis is inflammation of the eyelids. Blepharitis may happen with:  Reddish, scaly skin around the scalp and eyebrows.  Burning or itching of the eyelids.  Eye discharge at night that causes the eyelashes to stick together in the morning.  Eyelashes that fall out.  Sensitivity to light.  Follow these instructions at home: Pay attention to any changes in how you look or feel. Follow these instructions to help with your condition: Keeping Clean  Wash your hands often.  Wash your eyelids with warm water or with warm water that is mixed with a small amount of baby shampoo. Do this two times per day or as often as needed.  Wash your face and eyebrows at least once a day.  Use a clean towel each time you dry your eyelids. Do not use this towel to clean or dry other areas of your body. Do not share your towel with anyone. General instructions  Avoid wearing makeup until you get better. Do not share makeup with anyone.  Avoid rubbing your eyes.  Apply warm compresses to your eyes 2 times per day for 10 minutes at a time, or as  told by your health care provider.  If you were prescribed an antibiotic ointment or steroid drops, apply or use the medicine as told by your health care provider. Do not stop using the medicine even if you feel better.  Keep all follow-up visits as told by your health care provider. This is important. Contact a health care provider if:  Your eyelids feel hot.  You have blisters or a rash on your eyelids.  The condition does not go away in 2-4 days.  The inflammation gets worse. Get help right away if:  You have pain or redness that gets worse or spreads to other parts of your face.  Your vision changes.  You have pain when looking at lights or moving objects.  You have a fever. This information is not intended to replace advice given to you by your health care provider. Make sure you discuss any questions you have with your health care provider. Document Released: 06/02/2000 Document Revised: 11/11/2015 Document Reviewed: 09/28/2014 Elsevier Interactive Patient Education  [employer].  Thank you for coming in today. I hope you feel we met your needs.  Feel free to call PCP if you have any questions or further requests.  Please consider signing up for MyChart if you do not already have it, as this is a great way to communicate with me.  Best,  Whitney McVey, PA-C  IF you received an x-ray today, you will receive an invoice from [facility]. Please  contact [facility] at [phone] with questions or concerns regarding your invoice.   IF you received labwork today, you will receive an invoice from [facility]. Please contact [facility] at [phone] with questions or concerns regarding your invoice.   Our billing staff will not be able to assist you with questions regarding bills from these companies.  You will be contacted with the lab results as soon as they are available. The fastest way to get your results is to activate your My Chart account.  Instructions are located on the last page of this paperwork. If you have not heard from [place] regarding the results in 2 weeks, please contact this office.

## 2017-11-22 ENCOUNTER — Ambulatory Visit: Payer: Medicare Other | Admitting: Internal Medicine

## 2017-11-22 ENCOUNTER — Encounter: Payer: Self-pay | Admitting: Internal Medicine

## 2017-11-22 ENCOUNTER — Encounter

## 2017-11-22 VITALS — BP 120/60 | HR 63 | Ht 62.5 in | Wt 168.0 lb

## 2017-11-22 DIAGNOSIS — Z950 Presence of cardiac pacemaker: Secondary | ICD-10-CM

## 2017-11-22 DIAGNOSIS — I495 Sick sinus syndrome: Secondary | ICD-10-CM

## 2017-11-22 DIAGNOSIS — I459 Conduction disorder, unspecified: Secondary | ICD-10-CM

## 2017-11-22 LAB — CUP PACEART INCLINIC DEVICE CHECK
Battery Remaining Longevity: 139 mo
Battery Voltage: 3.01 V
Brady Statistic RA Percent Paced: 15 %
Brady Statistic RV Percent Paced: 0.06 %
Date Time Interrogation Session: 20190606100827
Implantable Lead Implant Date: 20160106
Implantable Lead Location: 753860
Lead Channel Impedance Value: 525 Ohm
Lead Channel Pacing Threshold Amplitude: 0.5 V
Lead Channel Pacing Threshold Pulse Width: 0.4 ms
Lead Channel Pacing Threshold Pulse Width: 0.4 ms
Lead Channel Sensing Intrinsic Amplitude: 12 mV
Lead Channel Setting Pacing Amplitude: 2 V
Lead Channel Setting Sensing Sensitivity: 2 mV
MDC IDC LEAD IMPLANT DT: 20160106
MDC IDC LEAD LOCATION: 753859
MDC IDC MSMT LEADCHNL RA IMPEDANCE VALUE: 387.5 Ohm
MDC IDC MSMT LEADCHNL RA PACING THRESHOLD AMPLITUDE: 0.5 V
MDC IDC MSMT LEADCHNL RA PACING THRESHOLD PULSEWIDTH: 0.4 ms
MDC IDC MSMT LEADCHNL RA SENSING INTR AMPL: 1.9 mV
MDC IDC MSMT LEADCHNL RV PACING THRESHOLD AMPLITUDE: 0.5 V
MDC IDC MSMT LEADCHNL RV PACING THRESHOLD AMPLITUDE: 0.5 V
MDC IDC MSMT LEADCHNL RV PACING THRESHOLD PULSEWIDTH: 0.4 ms
MDC IDC PG IMPLANT DT: 20160106
MDC IDC PG SERIAL: 7705987
MDC IDC SET LEADCHNL RV PACING AMPLITUDE: 0.75 V
MDC IDC SET LEADCHNL RV PACING PULSEWIDTH: 0.4 ms
Pulse Gen Model: 2240

## 2017-11-22 NOTE — Patient Instructions (Signed)
Medication Instructions:  Your physician recommends that you continue on your current medications as directed. Please refer to the Current Medication list given to you today.  Labwork: None ordered.  Testing/Procedures: None ordered.  Follow-Up: Your physician wants you to follow-up in: one year with Dr. Ladona Ridgelaylor.   You will receive a reminder letter in the mail two months in advance. If you don't receive a letter, please call our office to schedule the follow-up appointment.  Remote monitoring is used to monitor your Pacemaker from home. This monitoring reduces the number of office visits required to check your device to one time per year. It allows us to keep an eye on the functioning of your device to ensure it is working properly. You are scheduled for a device check from home on 12/05/2017. You may send your transmission at any time that day. If you have a wireless device, the transmission will be sent automatically. After your physician reviews your transmission, you will receive a postcard with your next transmission date.  Any Other Special Instructions Will Be Listed Below (If Applicable).  If you need a refill on your cardiac medications before your next appointment, please call your pharmacy.

## 2017-11-22 NOTE — Progress Notes (Signed)
HPI [name] returns today for pacemaker follow-up. She is a very pleasant 72 year old woman  With a h/o symptomatic sinus node dysfunction,s/p PPM. She has done well in the interim and denies chest pain or sob. No syncope.  No Known Allergies   Current Outpatient Medications  Medication Sig Dispense Refill  . Calcium-Magnesium-Vitamin D (CALCIUM MAGNESIUM PO) Take 1 capsule by mouth daily.    . Cholecalciferol (VITAMIN D3) 5000 UNITS CAPS Take 1 capsule by mouth daily.    Marland Kitchen. ibuprofen (ADVIL,MOTRIN) 200 MG tablet Take 400 mg by mouth every 6 (six) hours as needed (pain).     . Multiple Vitamin (MULTIVITAMIN) tablet Take 1 tablet by mouth daily as needed (Vitamin supplement).     . Omega-3 Fatty Acids (FISH OIL PO) Take 900 mg by mouth daily.     No current facility-administered medications for this visit.      Past Medical History:  Diagnosis Date  . Dysrhythmia    had 16 sec. of asystole that lead to pacemaker placement  . Head injury, acute, with loss of consciousness (HCC)   . History of syncope   . Sinus node dysfunction (HCC)    extrinsic  . Vaso vagal episode     ROS:   All systems reviewed and negative except as noted in the HPI.   Past Surgical History:  Procedure Laterality Date  . OPEN REDUCTION INTERNAL FIXATION (ORIF) DISTAL RADIAL FRACTURE Right 06/29/2014   Procedure: OPEN REDUCTION INTERNAL FIXATION (ORIF)  RIGHT DISTAL RADIAL FRACTURE;  Surgeon: Betha LoaKevin Kuzma, MD;  Location: [facility] OR;  Service: Orthopedics;  Laterality: Right;  . PERMANENT PACEMAKER INSERTION N/A 06/24/2014   Procedure: PERMANENT PACEMAKER INSERTION;  Surgeon: Marinus MawGregg W Matalyn Nawaz, MD;  [facility];  Service: Cardiovascular;  Laterality: N/A;     Family History  Problem Relation Age of Onset  . Heart disease Father   . Breast cancer Sister        840's  . Cancer Brother        Prostate  . Cancer Maternal Grandmother   . Heart disease Maternal Grandfather   . Stroke Paternal  Grandmother      Social History   Socioeconomic History  . Marital status: Single    Spouse name: Not on file  . Number of children: Not on file  . Years of education: Not on file  . Highest education level: Not on file  Occupational History  . Not on file  Social Needs  . Financial resource strain: Not on file  . Food insecurity:    Worry: Not on file    Inability: Not on file  . Transportation needs:    Medical: Not on file    Non-medical: Not on file  Tobacco Use  . Smoking status: Never Smoker  . Smokeless tobacco: Never Used  Substance and Sexual Activity  . Alcohol use: Yes    Comment: WINE less than 1 glass a week  . Drug use: No  . Sexual activity: Not Currently    Partners: Female    Comment: 1st intercourse 72 yo-Fewer than 5 partners  Lifestyle  . Physical activity:    Days per week: Not on file    Minutes per session: Not on file  . Stress: Not on file  Relationships  . Social connections:    Talks on phone: Not on file    Gets together: Not on file    Attends religious service: Not on file  Active member of club or organization: Not on file    Attends meetings of clubs or organizations: Not on file    Relationship status: Not on file  . Intimate partner violence:    Fear of current or ex partner: Not on file    Emotionally abused: Not on file    Physically abused: Not on file    Forced sexual activity: Not on file  Other Topics Concern  . Not on file  Social History Narrative  . Not on file     BP 120/60   Pulse 63   Ht 5' 2.5" (1.588 m)   Wt 168 lb (76.2 kg)   BMI 30.24 kg/m   Physical Exam:  Well appearing NAD HEENT: Unremarkable Neck:  No JVD, no thyromegally Lymphatics:  No adenopathy Back:  No CVA tenderness Lungs:  Clear HEART:  Regular rate rhythm, no murmurs, no rubs, no clicks Abd:  soft, positive bowel sounds, no organomegally, no rebound, no guarding Ext:  2 plus pulses, no edema, no cyanosis, no clubbing Skin:  No  rashes no nodules Neuro:  CN II through XII intact, motor grossly intact  EKG - nsr  DEVICE  Normal device function.  See PaceArt for details.   Assess/Plan: 1. Sinus node dysfunction - she is doing well and is asymptomatic. No change 2. PPM - her St. Jude DDD PM is working normally. We have turned on a rest mode so that she does not pace at 60 when her intrinsic rate is 58.   [name].D.

## 2017-12-05 ENCOUNTER — Ambulatory Visit (INDEPENDENT_AMBULATORY_CARE_PROVIDER_SITE_OTHER): Payer: Medicare Other | Admitting: *Deleted

## 2017-12-05 DIAGNOSIS — I495 Sick sinus syndrome: Secondary | ICD-10-CM

## 2017-12-05 NOTE — Progress Notes (Signed)
Remote pacemaker transmission.   

## 2017-12-25 LAB — CUP PACEART REMOTE DEVICE CHECK
Battery Remaining Longevity: 134 mo
Battery Remaining Percentage: 95.5 %
Brady Statistic AS VP Percent: 1 %
Brady Statistic RA Percent Paced: 9.4 %
Date Time Interrogation Session: 20190619060015
Implantable Lead Implant Date: 20160106
Implantable Lead Location: 753860
Lead Channel Impedance Value: 380 Ohm
Lead Channel Pacing Threshold Amplitude: 0.5 V
Lead Channel Pacing Threshold Pulse Width: 0.4 ms
Lead Channel Sensing Intrinsic Amplitude: 1.4 mV
Lead Channel Sensing Intrinsic Amplitude: 12 mV
Lead Channel Setting Pacing Amplitude: 2 V
Lead Channel Setting Sensing Sensitivity: 2 mV
MDC IDC LEAD IMPLANT DT: 20160106
MDC IDC LEAD LOCATION: 753859
MDC IDC MSMT BATTERY VOLTAGE: 3.01 V
MDC IDC MSMT LEADCHNL RV IMPEDANCE VALUE: 580 Ohm
MDC IDC MSMT LEADCHNL RV PACING THRESHOLD AMPLITUDE: 0.5 V
MDC IDC MSMT LEADCHNL RV PACING THRESHOLD PULSEWIDTH: 0.4 ms
MDC IDC PG IMPLANT DT: 20160106
MDC IDC PG SERIAL: 7705987
MDC IDC SET LEADCHNL RV PACING AMPLITUDE: 0.75 V
MDC IDC SET LEADCHNL RV PACING PULSEWIDTH: 0.4 ms
MDC IDC STAT BRADY AP VP PERCENT: 1 %
MDC IDC STAT BRADY AP VS PERCENT: 9.8 %
MDC IDC STAT BRADY AS VS PERCENT: 90 %
MDC IDC STAT BRADY RV PERCENT PACED: 1 %
Pulse Gen Model: 2240

## 2018-02-14 DIAGNOSIS — H2513 Age-related nuclear cataract, bilateral: Secondary | ICD-10-CM | POA: Diagnosis not present

## 2018-02-14 DIAGNOSIS — H52223 Regular astigmatism, bilateral: Secondary | ICD-10-CM | POA: Diagnosis not present

## 2018-02-14 DIAGNOSIS — H5213 Myopia, bilateral: Secondary | ICD-10-CM | POA: Diagnosis not present

## 2018-03-06 ENCOUNTER — Telehealth: Payer: Self-pay | Admitting: Cardiology

## 2018-03-06 ENCOUNTER — Encounter: Payer: Medicare Other | Admitting: *Deleted

## 2018-03-06 NOTE — Telephone Encounter (Signed)
LMOVM reminding pt to send remote transmission.   

## 2018-03-07 ENCOUNTER — Encounter: Payer: Self-pay | Admitting: Cardiology

## 2018-03-11 ENCOUNTER — Ambulatory Visit (INDEPENDENT_AMBULATORY_CARE_PROVIDER_SITE_OTHER): Payer: Medicare Other | Admitting: *Deleted

## 2018-03-11 DIAGNOSIS — I495 Sick sinus syndrome: Secondary | ICD-10-CM | POA: Diagnosis not present

## 2018-03-11 NOTE — Progress Notes (Signed)
Remote pacemaker transmission.   

## 2018-03-12 ENCOUNTER — Encounter: Payer: Self-pay | Admitting: Cardiology

## 2018-03-19 DIAGNOSIS — H527 Unspecified disorder of refraction: Secondary | ICD-10-CM | POA: Diagnosis not present

## 2018-03-19 DIAGNOSIS — H2513 Age-related nuclear cataract, bilateral: Secondary | ICD-10-CM | POA: Diagnosis not present

## 2018-03-30 LAB — CUP PACEART REMOTE DEVICE CHECK
Battery Remaining Percentage: 95.5 %
Battery Voltage: 3.01 V
Brady Statistic AP VP Percent: 1 %
Brady Statistic AS VS Percent: 92 %
Brady Statistic RA Percent Paced: 7.5 %
Brady Statistic RV Percent Paced: 1 %
Date Time Interrogation Session: 20190922080405
Implantable Lead Implant Date: 20160106
Implantable Lead Implant Date: 20160106
Implantable Lead Location: 753860
Implantable Pulse Generator Implant Date: 20160106
Lead Channel Impedance Value: 590 Ohm
Lead Channel Pacing Threshold Amplitude: 0.5 V
Lead Channel Pacing Threshold Pulse Width: 0.4 ms
Lead Channel Sensing Intrinsic Amplitude: 3.1 mV
Lead Channel Setting Pacing Pulse Width: 0.4 ms
Lead Channel Setting Sensing Sensitivity: 2 mV
MDC IDC LEAD LOCATION: 753859
MDC IDC MSMT BATTERY REMAINING LONGEVITY: 134 mo
MDC IDC MSMT LEADCHNL RA IMPEDANCE VALUE: 390 Ohm
MDC IDC MSMT LEADCHNL RV PACING THRESHOLD AMPLITUDE: 0.5 V
MDC IDC MSMT LEADCHNL RV PACING THRESHOLD PULSEWIDTH: 0.4 ms
MDC IDC MSMT LEADCHNL RV SENSING INTR AMPL: 12 mV
MDC IDC PG SERIAL: 7705987
MDC IDC SET LEADCHNL RA PACING AMPLITUDE: 2 V
MDC IDC SET LEADCHNL RV PACING AMPLITUDE: 0.75 V
MDC IDC STAT BRADY AP VS PERCENT: 7.8 %
MDC IDC STAT BRADY AS VP PERCENT: 1 %
Pulse Gen Model: 2240

## 2018-04-25 DIAGNOSIS — Z961 Presence of intraocular lens: Secondary | ICD-10-CM | POA: Diagnosis not present

## 2018-04-25 DIAGNOSIS — H1851 Endothelial corneal dystrophy: Secondary | ICD-10-CM | POA: Diagnosis not present

## 2018-04-25 DIAGNOSIS — H2513 Age-related nuclear cataract, bilateral: Secondary | ICD-10-CM | POA: Diagnosis not present

## 2018-04-25 DIAGNOSIS — Z9849 Cataract extraction status, unspecified eye: Secondary | ICD-10-CM | POA: Diagnosis not present

## 2018-04-25 DIAGNOSIS — H2511 Age-related nuclear cataract, right eye: Secondary | ICD-10-CM | POA: Diagnosis not present

## 2018-04-25 DIAGNOSIS — Z950 Presence of cardiac pacemaker: Secondary | ICD-10-CM | POA: Diagnosis not present

## 2018-04-30 DIAGNOSIS — Z961 Presence of intraocular lens: Secondary | ICD-10-CM | POA: Diagnosis not present

## 2018-04-30 DIAGNOSIS — H2512 Age-related nuclear cataract, left eye: Secondary | ICD-10-CM | POA: Diagnosis not present

## 2018-05-07 DIAGNOSIS — Z9841 Cataract extraction status, right eye: Secondary | ICD-10-CM | POA: Diagnosis not present

## 2018-05-07 DIAGNOSIS — Z961 Presence of intraocular lens: Secondary | ICD-10-CM | POA: Diagnosis not present

## 2018-05-07 DIAGNOSIS — H1851 Endothelial corneal dystrophy: Secondary | ICD-10-CM | POA: Diagnosis not present

## 2018-05-07 DIAGNOSIS — H2512 Age-related nuclear cataract, left eye: Secondary | ICD-10-CM | POA: Diagnosis not present

## 2018-05-07 DIAGNOSIS — Z950 Presence of cardiac pacemaker: Secondary | ICD-10-CM | POA: Diagnosis not present

## 2018-05-07 DIAGNOSIS — H527 Unspecified disorder of refraction: Secondary | ICD-10-CM | POA: Diagnosis not present

## 2018-06-10 ENCOUNTER — Ambulatory Visit (INDEPENDENT_AMBULATORY_CARE_PROVIDER_SITE_OTHER): Payer: Medicare Other

## 2018-06-10 DIAGNOSIS — I495 Sick sinus syndrome: Secondary | ICD-10-CM

## 2018-06-10 NOTE — Progress Notes (Signed)
Remote pacemaker transmission.   

## 2018-06-12 LAB — CUP PACEART REMOTE DEVICE CHECK
Battery Remaining Percentage: 95.5 %
Battery Voltage: 3.01 V
Brady Statistic AP VP Percent: 1 %
Brady Statistic AS VS Percent: 93 %
Brady Statistic RA Percent Paced: 6.4 %
Implantable Lead Implant Date: 20160106
Implantable Lead Implant Date: 20160106
Implantable Lead Location: 753859
Implantable Lead Location: 753860
Implantable Pulse Generator Implant Date: 20160106
Lead Channel Impedance Value: 540 Ohm
Lead Channel Pacing Threshold Amplitude: 0.5 V
Lead Channel Pacing Threshold Pulse Width: 0.4 ms
Lead Channel Sensing Intrinsic Amplitude: 2.6 mV
Lead Channel Setting Sensing Sensitivity: 2 mV
MDC IDC MSMT BATTERY REMAINING LONGEVITY: 134 mo
MDC IDC MSMT LEADCHNL RA IMPEDANCE VALUE: 390 Ohm
MDC IDC MSMT LEADCHNL RV PACING THRESHOLD AMPLITUDE: 0.625 V
MDC IDC MSMT LEADCHNL RV PACING THRESHOLD PULSEWIDTH: 0.4 ms
MDC IDC MSMT LEADCHNL RV SENSING INTR AMPL: 12 mV
MDC IDC PG SERIAL: 7705987
MDC IDC SESS DTM: 20191223070014
MDC IDC SET LEADCHNL RA PACING AMPLITUDE: 2 V
MDC IDC SET LEADCHNL RV PACING AMPLITUDE: 0.875
MDC IDC SET LEADCHNL RV PACING PULSEWIDTH: 0.4 ms
MDC IDC STAT BRADY AP VS PERCENT: 6.7 %
MDC IDC STAT BRADY AS VP PERCENT: 1 %
MDC IDC STAT BRADY RV PERCENT PACED: 1 %

## 2018-06-13 DIAGNOSIS — Z0000 Encounter for general adult medical examination without abnormal findings: Secondary | ICD-10-CM | POA: Diagnosis not present

## 2018-08-06 DIAGNOSIS — Z961 Presence of intraocular lens: Secondary | ICD-10-CM | POA: Diagnosis not present

## 2018-08-06 DIAGNOSIS — H1851 Endothelial corneal dystrophy: Secondary | ICD-10-CM | POA: Diagnosis not present

## 2018-08-15 DIAGNOSIS — M5416 Radiculopathy, lumbar region: Secondary | ICD-10-CM | POA: Diagnosis not present

## 2018-09-04 DIAGNOSIS — H1851 Endothelial corneal dystrophy: Secondary | ICD-10-CM | POA: Diagnosis not present

## 2018-09-04 DIAGNOSIS — Z961 Presence of intraocular lens: Secondary | ICD-10-CM | POA: Diagnosis not present

## 2018-09-09 ENCOUNTER — Ambulatory Visit (INDEPENDENT_AMBULATORY_CARE_PROVIDER_SITE_OTHER): Payer: Medicare Other | Admitting: *Deleted

## 2018-09-09 ENCOUNTER — Other Ambulatory Visit: Payer: Self-pay

## 2018-09-09 DIAGNOSIS — I495 Sick sinus syndrome: Secondary | ICD-10-CM | POA: Diagnosis not present

## 2018-09-10 LAB — CUP PACEART REMOTE DEVICE CHECK
Battery Remaining Longevity: 134 mo
Battery Remaining Percentage: 95.5 %
Battery Voltage: 3.01 V
Brady Statistic AP VS Percent: 6.6 %
Brady Statistic AS VS Percent: 93 %
Date Time Interrogation Session: 20200323060032
Implantable Lead Implant Date: 20160106
Implantable Lead Location: 753859
Implantable Pulse Generator Implant Date: 20160106
Lead Channel Impedance Value: 390 Ohm
Lead Channel Pacing Threshold Amplitude: 0.5 V
Lead Channel Pacing Threshold Amplitude: 0.5 V
Lead Channel Pacing Threshold Pulse Width: 0.4 ms
Lead Channel Sensing Intrinsic Amplitude: 2.6 mV
Lead Channel Setting Pacing Pulse Width: 0.4 ms
MDC IDC LEAD IMPLANT DT: 20160106
MDC IDC LEAD LOCATION: 753860
MDC IDC MSMT LEADCHNL RA PACING THRESHOLD PULSEWIDTH: 0.4 ms
MDC IDC MSMT LEADCHNL RV IMPEDANCE VALUE: 550 Ohm
MDC IDC MSMT LEADCHNL RV SENSING INTR AMPL: 12 mV
MDC IDC SET LEADCHNL RA PACING AMPLITUDE: 2 V
MDC IDC SET LEADCHNL RV PACING AMPLITUDE: 0.75 V
MDC IDC SET LEADCHNL RV SENSING SENSITIVITY: 2 mV
MDC IDC STAT BRADY AP VP PERCENT: 1 %
MDC IDC STAT BRADY AS VP PERCENT: 1 %
MDC IDC STAT BRADY RA PERCENT PACED: 6.3 %
MDC IDC STAT BRADY RV PERCENT PACED: 1 %
Pulse Gen Model: 2240
Pulse Gen Serial Number: 7705987

## 2018-09-18 NOTE — Progress Notes (Signed)
Remote pacemaker transmission.   

## 2018-11-25 ENCOUNTER — Telehealth: Payer: Self-pay | Admitting: Internal Medicine

## 2018-11-25 NOTE — Telephone Encounter (Signed)
New message    [facility] for pt to call back about appt on 06.16.20 with Dr. Lovena Le. Will change visit to virtual-video if pt has a smart phone or phone if pt does not. When pt returns call, please reach out via secure chat. I will speak to pt.

## 2018-12-03 ENCOUNTER — Encounter: Payer: Medicare Other | Admitting: Internal Medicine

## 2018-12-09 ENCOUNTER — Ambulatory Visit (INDEPENDENT_AMBULATORY_CARE_PROVIDER_SITE_OTHER): Payer: Medicare Other | Admitting: *Deleted

## 2018-12-09 DIAGNOSIS — R55 Syncope and collapse: Secondary | ICD-10-CM

## 2018-12-09 DIAGNOSIS — I495 Sick sinus syndrome: Secondary | ICD-10-CM

## 2018-12-09 LAB — CUP PACEART REMOTE DEVICE CHECK
Battery Remaining Longevity: 135 mo
Battery Remaining Percentage: 95.5 %
Battery Voltage: 3.01 V
Brady Statistic AP VP Percent: 1 %
Brady Statistic AP VS Percent: 6.4 %
Brady Statistic AS VP Percent: 1 %
Brady Statistic AS VS Percent: 94 %
Brady Statistic RA Percent Paced: 6.2 %
Brady Statistic RV Percent Paced: 1 %
Date Time Interrogation Session: 20200622060013
Implantable Lead Implant Date: 20160106
Implantable Lead Implant Date: 20160106
Implantable Lead Location: 753859
Implantable Lead Location: 753860
Implantable Pulse Generator Implant Date: 20160106
Lead Channel Impedance Value: 430 Ohm
Lead Channel Impedance Value: 580 Ohm
Lead Channel Pacing Threshold Amplitude: 0.5 V
Lead Channel Pacing Threshold Amplitude: 0.625 V
Lead Channel Pacing Threshold Pulse Width: 0.4 ms
Lead Channel Pacing Threshold Pulse Width: 0.4 ms
Lead Channel Sensing Intrinsic Amplitude: 12 mV
Lead Channel Sensing Intrinsic Amplitude: 3.6 mV
Lead Channel Setting Pacing Amplitude: 0.875
Lead Channel Setting Pacing Amplitude: 2 V
Lead Channel Setting Pacing Pulse Width: 0.4 ms
Lead Channel Setting Sensing Sensitivity: 2 mV
Pulse Gen Model: 2240
Pulse Gen Serial Number: 7705987

## 2018-12-18 NOTE — Progress Notes (Signed)
Remote pacemaker transmission.   

## 2019-03-10 LAB — CUP PACEART REMOTE DEVICE CHECK
Battery Remaining Longevity: 134 mo
Battery Remaining Percentage: 95.5 %
Battery Voltage: 3.01 V
Brady Statistic AP VP Percent: 1 %
Brady Statistic AP VS Percent: 6.8 %
Brady Statistic AS VP Percent: 1 %
Brady Statistic AS VS Percent: 93 %
Brady Statistic RA Percent Paced: 6.6 %
Brady Statistic RV Percent Paced: 1 %
Date Time Interrogation Session: 20200921060013
Implantable Lead Implant Date: 20160106
Implantable Lead Implant Date: 20160106
Implantable Lead Location: 753859
Implantable Lead Location: 753860
Implantable Pulse Generator Implant Date: 20160106
Lead Channel Impedance Value: 400 Ohm
Lead Channel Impedance Value: 580 Ohm
Lead Channel Pacing Threshold Amplitude: 0.5 V
Lead Channel Pacing Threshold Amplitude: 0.625 V
Lead Channel Pacing Threshold Pulse Width: 0.4 ms
Lead Channel Pacing Threshold Pulse Width: 0.4 ms
Lead Channel Sensing Intrinsic Amplitude: 12 mV
Lead Channel Sensing Intrinsic Amplitude: 2.8 mV
Lead Channel Setting Pacing Amplitude: 0.875
Lead Channel Setting Pacing Amplitude: 2 V
Lead Channel Setting Pacing Pulse Width: 0.4 ms
Lead Channel Setting Sensing Sensitivity: 2 mV
Pulse Gen Model: 2240
Pulse Gen Serial Number: 7705987

## 2019-03-11 ENCOUNTER — Ambulatory Visit (INDEPENDENT_AMBULATORY_CARE_PROVIDER_SITE_OTHER): Payer: Medicare Other | Admitting: *Deleted

## 2019-03-11 DIAGNOSIS — R55 Syncope and collapse: Secondary | ICD-10-CM

## 2019-03-11 DIAGNOSIS — I495 Sick sinus syndrome: Secondary | ICD-10-CM | POA: Diagnosis not present

## 2019-03-12 ENCOUNTER — Telehealth: Payer: Self-pay | Admitting: Emergency Medicine

## 2019-03-12 NOTE — Telephone Encounter (Signed)
Received alerts for episodes HVR EGMs showed episodes  of AT with Ave v-rate in 150s, longest HVR episode was on 03/05/19 and lasted 20 min. Patient reports she has been asymptomatic during times of recorded episodes. She has been very active  Doing yard work and cleaning out her gutters the past few weeks.

## 2019-03-16 NOTE — Telephone Encounter (Signed)
Watchful waiting.  

## 2019-03-17 DIAGNOSIS — Z961 Presence of intraocular lens: Secondary | ICD-10-CM | POA: Diagnosis not present

## 2019-03-17 DIAGNOSIS — H1851 Endothelial corneal dystrophy: Secondary | ICD-10-CM | POA: Diagnosis not present

## 2019-03-20 NOTE — Progress Notes (Signed)
Remote pacemaker transmission.   

## 2019-03-26 ENCOUNTER — Encounter: Payer: Self-pay | Admitting: Gynecology

## 2019-04-23 ENCOUNTER — Other Ambulatory Visit: Payer: Self-pay

## 2019-04-23 ENCOUNTER — Ambulatory Visit: Payer: Medicare Other | Admitting: Internal Medicine

## 2019-04-23 ENCOUNTER — Encounter: Payer: Self-pay | Admitting: Internal Medicine

## 2019-04-23 VITALS — BP 104/66 | HR 67 | Ht 62.5 in | Wt 158.0 lb

## 2019-04-23 DIAGNOSIS — I10 Essential (primary) hypertension: Secondary | ICD-10-CM | POA: Diagnosis not present

## 2019-04-23 DIAGNOSIS — I495 Sick sinus syndrome: Secondary | ICD-10-CM

## 2019-04-23 DIAGNOSIS — Z950 Presence of cardiac pacemaker: Secondary | ICD-10-CM | POA: Diagnosis not present

## 2019-04-23 NOTE — Progress Notes (Signed)
HPI [name] returns today to followup of sinus node dysfunction. She has symptomatic sinus node dysfunction and is s/p PPM insertion. No chest pain or sob. No syncope. No Known Allergies   Current Outpatient Medications  Medication Sig Dispense Refill  . Calcium-Magnesium-Vitamin D (CALCIUM MAGNESIUM PO) Take 1 capsule by mouth daily.    . Cholecalciferol (VITAMIN D3) 5000 UNITS CAPS Take 1 capsule by mouth daily.    Marland Kitchen ibuprofen (ADVIL,MOTRIN) 200 MG tablet Take 400 mg by mouth every 6 (six) hours as needed (pain).     . Multiple Vitamin (MULTIVITAMIN) tablet Take 1 tablet by mouth daily as needed (Vitamin supplement).     . Omega-3 Fatty Acids (FISH OIL PO) Take 900 mg by mouth daily.     No current facility-administered medications for this visit.      Past Medical History:  Diagnosis Date  . Dysrhythmia    had 16 sec. of asystole that lead to pacemaker placement  . Head injury, acute, with loss of consciousness (HCC)   . History of syncope   . Sinus node dysfunction (HCC)    extrinsic  . Vaso vagal episode     ROS:   All systems reviewed and negative except as noted in the HPI.   Past Surgical History:  Procedure Laterality Date  . OPEN REDUCTION INTERNAL FIXATION (ORIF) DISTAL RADIAL FRACTURE Right 06/29/2014   Procedure: OPEN REDUCTION INTERNAL FIXATION (ORIF)  RIGHT DISTAL RADIAL FRACTURE;  Surgeon: Betha Loa, MD;  Location: [facility] OR;  Service: Orthopedics;  Laterality: Right;  . PERMANENT PACEMAKER INSERTION N/A 06/24/2014   Procedure: PERMANENT PACEMAKER INSERTION;  Surgeon: Marinus Maw, MD;  [facility] CATH LAB;  Service: Cardiovascular;  Laterality: N/A;     Family History  Problem Relation Age of Onset  . Heart disease Father   . Breast cancer Sister        69's  . Cancer Brother        Prostate  . Cancer Maternal Grandmother   . Heart disease Maternal Grandfather   . Stroke Paternal Grandmother      Social History   Socioeconomic  History  . Marital status: Single    Spouse name: Not on file  . Number of children: Not on file  . Years of education: Not on file  . Highest education level: Not on file  Occupational History  . Not on file  Social Needs  . Financial resource strain: Not on file  . Food insecurity    Worry: Not on file    Inability: Not on file  . Transportation needs    Medical: Not on file    Non-medical: Not on file  Tobacco Use  . Smoking status: Never Smoker  . Smokeless tobacco: Never Used  Substance and Sexual Activity  . Alcohol use: Yes    Comment: WINE less than 1 glass a week  . Drug use: No  . Sexual activity: Not Currently    Partners: Female    Comment: 1st intercourse 73 yo-Fewer than 5 partners  Lifestyle  . Physical activity    Days per week: Not on file    Minutes per session: Not on file  . Stress: Not on file  Relationships  . Social Musician on phone: Not on file    Gets together: Not on file    Attends religious service: Not on file    Active member of club or organization: Not on  file    Attends meetings of clubs or organizations: Not on file    Relationship status: Not on file  . Intimate partner violence    Fear of current or ex partner: Not on file    Emotionally abused: Not on file    Physically abused: Not on file    Forced sexual activity: Not on file  Other Topics Concern  . Not on file  Social History Narrative  . Not on file     BP 104/66   Pulse 67   Ht 5' 2.5" (1.588 m)   Wt 158 lb (71.7 kg)   SpO2 99%   BMI 28.44 kg/m   Physical Exam:  Well appearing NAD HEENT: Unremarkable Neck:  No JVD, no thyromegally Lymphatics:  No adenopathy Back:  No CVA tenderness Lungs:  Clear HEART:  Regular rate rhythm, no murmurs, no rubs, no clicks Abd:  soft, positive bowel sounds, no organomegally, no rebound, no guarding Ext:  2 plus pulses, no edema, no cyanosis, no clubbing Skin:  No rashes no nodules Neuro:  CN II through XII  intact, motor grossly intact  EKG - nsr  DEVICE  Normal device function.  See PaceArt for details.   Assess/Plan: 1. Sinus node dysfunction- she is asymptomatic, s/p PPM insertion. 2. PPM - her St. Jude DDD PM is working normally.   Mikle Bosworth.D.

## 2019-04-23 NOTE — Patient Instructions (Signed)

## 2019-06-10 ENCOUNTER — Ambulatory Visit (INDEPENDENT_AMBULATORY_CARE_PROVIDER_SITE_OTHER): Payer: Medicare Other | Admitting: *Deleted

## 2019-06-10 DIAGNOSIS — I495 Sick sinus syndrome: Secondary | ICD-10-CM

## 2019-06-10 LAB — CUP PACEART REMOTE DEVICE CHECK
Battery Remaining Longevity: 133 mo
Battery Remaining Percentage: 95.5 %
Battery Voltage: 3.01 V
Brady Statistic AP VP Percent: 1 %
Brady Statistic AP VS Percent: 8.9 %
Brady Statistic AS VP Percent: 1 %
Brady Statistic AS VS Percent: 90 %
Brady Statistic RA Percent Paced: 8.1 %
Brady Statistic RV Percent Paced: 1 %
Date Time Interrogation Session: 20201221020014
Implantable Lead Implant Date: 20160106
Implantable Lead Implant Date: 20160106
Implantable Lead Location: 753859
Implantable Lead Location: 753860
Implantable Pulse Generator Implant Date: 20160106
Lead Channel Impedance Value: 380 Ohm
Lead Channel Impedance Value: 510 Ohm
Lead Channel Pacing Threshold Amplitude: 0.5 V
Lead Channel Pacing Threshold Amplitude: 0.625 V
Lead Channel Pacing Threshold Pulse Width: 0.4 ms
Lead Channel Pacing Threshold Pulse Width: 0.4 ms
Lead Channel Sensing Intrinsic Amplitude: 12 mV
Lead Channel Sensing Intrinsic Amplitude: 3 mV
Lead Channel Setting Pacing Amplitude: 0.875
Lead Channel Setting Pacing Amplitude: 2 V
Lead Channel Setting Pacing Pulse Width: 0.4 ms
Lead Channel Setting Sensing Sensitivity: 2 mV
Pulse Gen Model: 2240
Pulse Gen Serial Number: 7705987

## 2019-09-09 ENCOUNTER — Ambulatory Visit (INDEPENDENT_AMBULATORY_CARE_PROVIDER_SITE_OTHER): Payer: Medicare Other | Admitting: *Deleted

## 2019-09-09 DIAGNOSIS — I495 Sick sinus syndrome: Secondary | ICD-10-CM

## 2019-09-09 LAB — CUP PACEART REMOTE DEVICE CHECK
Battery Remaining Longevity: 133 mo
Battery Remaining Percentage: 95.5 %
Battery Voltage: 3.01 V
Brady Statistic AP VP Percent: 1 %
Brady Statistic AP VS Percent: 12 %
Brady Statistic AS VP Percent: 1 %
Brady Statistic AS VS Percent: 85 %
Brady Statistic RA Percent Paced: 9.3 %
Brady Statistic RV Percent Paced: 1 %
Date Time Interrogation Session: 20210323021121
Implantable Lead Implant Date: 20160106
Implantable Lead Implant Date: 20160106
Implantable Lead Location: 753859
Implantable Lead Location: 753860
Implantable Pulse Generator Implant Date: 20160106
Lead Channel Impedance Value: 400 Ohm
Lead Channel Impedance Value: 540 Ohm
Lead Channel Pacing Threshold Amplitude: 0.5 V
Lead Channel Pacing Threshold Amplitude: 0.5 V
Lead Channel Pacing Threshold Pulse Width: 0.4 ms
Lead Channel Pacing Threshold Pulse Width: 0.4 ms
Lead Channel Sensing Intrinsic Amplitude: 12 mV
Lead Channel Sensing Intrinsic Amplitude: 2.1 mV
Lead Channel Setting Pacing Amplitude: 0.75 V
Lead Channel Setting Pacing Amplitude: 2 V
Lead Channel Setting Pacing Pulse Width: 0.4 ms
Lead Channel Setting Sensing Sensitivity: 2 mV
Pulse Gen Model: 2240
Pulse Gen Serial Number: 7705987

## 2019-09-10 NOTE — Progress Notes (Signed)
PPM Remote  

## 2019-12-09 ENCOUNTER — Ambulatory Visit (INDEPENDENT_AMBULATORY_CARE_PROVIDER_SITE_OTHER): Payer: Medicare Other | Admitting: *Deleted

## 2019-12-09 DIAGNOSIS — I495 Sick sinus syndrome: Secondary | ICD-10-CM | POA: Diagnosis not present

## 2019-12-09 LAB — CUP PACEART REMOTE DEVICE CHECK
Battery Remaining Longevity: 133 mo
Battery Remaining Percentage: 95.5 %
Battery Voltage: 2.99 V
Brady Statistic AP VP Percent: 1 %
Brady Statistic AP VS Percent: 13 %
Brady Statistic AS VP Percent: 1 %
Brady Statistic AS VS Percent: 84 %
Brady Statistic RA Percent Paced: 9.8 %
Brady Statistic RV Percent Paced: 1 %
Date Time Interrogation Session: 20210622020015
Implantable Lead Implant Date: 20160106
Implantable Lead Implant Date: 20160106
Implantable Lead Location: 753859
Implantable Lead Location: 753860
Implantable Pulse Generator Implant Date: 20160106
Lead Channel Impedance Value: 400 Ohm
Lead Channel Impedance Value: 530 Ohm
Lead Channel Pacing Threshold Amplitude: 0.5 V
Lead Channel Pacing Threshold Amplitude: 0.5 V
Lead Channel Pacing Threshold Pulse Width: 0.4 ms
Lead Channel Pacing Threshold Pulse Width: 0.4 ms
Lead Channel Sensing Intrinsic Amplitude: 1.7 mV
Lead Channel Sensing Intrinsic Amplitude: 12 mV
Lead Channel Setting Pacing Amplitude: 0.75 V
Lead Channel Setting Pacing Amplitude: 2 V
Lead Channel Setting Pacing Pulse Width: 0.4 ms
Lead Channel Setting Sensing Sensitivity: 2 mV
Pulse Gen Model: 2240
Pulse Gen Serial Number: 7705987

## 2019-12-10 NOTE — Progress Notes (Signed)
Remote pacemaker transmission.   

## 2020-03-09 ENCOUNTER — Ambulatory Visit (INDEPENDENT_AMBULATORY_CARE_PROVIDER_SITE_OTHER): Payer: Medicare Other | Admitting: *Deleted

## 2020-03-09 DIAGNOSIS — I495 Sick sinus syndrome: Secondary | ICD-10-CM

## 2020-03-09 LAB — CUP PACEART REMOTE DEVICE CHECK
Battery Remaining Longevity: 133 mo
Battery Remaining Percentage: 95.5 %
Battery Voltage: 2.99 V
Brady Statistic AP VP Percent: 1 %
Brady Statistic AP VS Percent: 12 %
Brady Statistic AS VP Percent: 1 %
Brady Statistic AS VS Percent: 86 %
Brady Statistic RA Percent Paced: 9.3 %
Brady Statistic RV Percent Paced: 1 %
Date Time Interrogation Session: 20210921020013
Implantable Lead Implant Date: 20160106
Implantable Lead Implant Date: 20160106
Implantable Lead Location: 753859
Implantable Lead Location: 753860
Implantable Pulse Generator Implant Date: 20160106
Lead Channel Impedance Value: 400 Ohm
Lead Channel Impedance Value: 510 Ohm
Lead Channel Pacing Threshold Amplitude: 0.5 V
Lead Channel Pacing Threshold Amplitude: 0.625 V
Lead Channel Pacing Threshold Pulse Width: 0.4 ms
Lead Channel Pacing Threshold Pulse Width: 0.4 ms
Lead Channel Sensing Intrinsic Amplitude: 12 mV
Lead Channel Sensing Intrinsic Amplitude: 2.7 mV
Lead Channel Setting Pacing Amplitude: 0.875
Lead Channel Setting Pacing Amplitude: 2 V
Lead Channel Setting Pacing Pulse Width: 0.4 ms
Lead Channel Setting Sensing Sensitivity: 2 mV
Pulse Gen Model: 2240
Pulse Gen Serial Number: 7705987

## 2020-03-10 NOTE — Progress Notes (Signed)
Remote pacemaker transmission.   

## 2020-06-09 ENCOUNTER — Ambulatory Visit (INDEPENDENT_AMBULATORY_CARE_PROVIDER_SITE_OTHER): Payer: Medicare Other

## 2020-06-09 DIAGNOSIS — I495 Sick sinus syndrome: Secondary | ICD-10-CM

## 2020-06-13 LAB — CUP PACEART REMOTE DEVICE CHECK
Battery Remaining Longevity: 133 mo
Battery Remaining Percentage: 95.5 %
Battery Voltage: 2.99 V
Brady Statistic AP VP Percent: 1 %
Brady Statistic AP VS Percent: 11 %
Brady Statistic AS VP Percent: 1 %
Brady Statistic AS VS Percent: 87 %
Brady Statistic RA Percent Paced: 8.8 %
Brady Statistic RV Percent Paced: 1 %
Date Time Interrogation Session: 20211222030310
Implantable Lead Implant Date: 20160106
Implantable Lead Implant Date: 20160106
Implantable Lead Location: 753859
Implantable Lead Location: 753860
Implantable Pulse Generator Implant Date: 20160106
Lead Channel Impedance Value: 400 Ohm
Lead Channel Impedance Value: 530 Ohm
Lead Channel Pacing Threshold Amplitude: 0.5 V
Lead Channel Pacing Threshold Amplitude: 0.625 V
Lead Channel Pacing Threshold Pulse Width: 0.4 ms
Lead Channel Pacing Threshold Pulse Width: 0.4 ms
Lead Channel Sensing Intrinsic Amplitude: 12 mV
Lead Channel Sensing Intrinsic Amplitude: 2.8 mV
Lead Channel Setting Pacing Amplitude: 0.875
Lead Channel Setting Pacing Amplitude: 2 V
Lead Channel Setting Pacing Pulse Width: 0.4 ms
Lead Channel Setting Sensing Sensitivity: 2 mV
Pulse Gen Model: 2240
Pulse Gen Serial Number: 7705987

## 2020-06-23 NOTE — Progress Notes (Signed)
Remote pacemaker transmission.   

## 2020-09-08 ENCOUNTER — Ambulatory Visit (INDEPENDENT_AMBULATORY_CARE_PROVIDER_SITE_OTHER): Payer: Medicare Other

## 2020-09-08 DIAGNOSIS — I495 Sick sinus syndrome: Secondary | ICD-10-CM | POA: Diagnosis not present

## 2020-09-08 LAB — CUP PACEART REMOTE DEVICE CHECK
Battery Remaining Longevity: 133 mo
Battery Remaining Percentage: 95.5 %
Battery Voltage: 2.99 V
Brady Statistic AP VP Percent: 1 %
Brady Statistic AP VS Percent: 9.4 %
Brady Statistic AS VP Percent: 1 %
Brady Statistic AS VS Percent: 89 %
Brady Statistic RA Percent Paced: 7.7 %
Brady Statistic RV Percent Paced: 1 %
Date Time Interrogation Session: 20220323020014
Implantable Lead Implant Date: 20160106
Implantable Lead Implant Date: 20160106
Implantable Lead Location: 753859
Implantable Lead Location: 753860
Implantable Pulse Generator Implant Date: 20160106
Lead Channel Impedance Value: 400 Ohm
Lead Channel Impedance Value: 480 Ohm
Lead Channel Pacing Threshold Amplitude: 0.5 V
Lead Channel Pacing Threshold Amplitude: 0.625 V
Lead Channel Pacing Threshold Pulse Width: 0.4 ms
Lead Channel Pacing Threshold Pulse Width: 0.4 ms
Lead Channel Sensing Intrinsic Amplitude: 12 mV
Lead Channel Sensing Intrinsic Amplitude: 3.5 mV
Lead Channel Setting Pacing Amplitude: 0.875
Lead Channel Setting Pacing Amplitude: 2 V
Lead Channel Setting Pacing Pulse Width: 0.4 ms
Lead Channel Setting Sensing Sensitivity: 2 mV
Pulse Gen Model: 2240
Pulse Gen Serial Number: 7705987

## 2020-09-17 NOTE — Progress Notes (Signed)
Remote pacemaker transmission.   

## 2020-12-08 ENCOUNTER — Ambulatory Visit (INDEPENDENT_AMBULATORY_CARE_PROVIDER_SITE_OTHER): Payer: Medicare Other

## 2020-12-08 DIAGNOSIS — I495 Sick sinus syndrome: Secondary | ICD-10-CM | POA: Diagnosis not present

## 2020-12-08 LAB — CUP PACEART REMOTE DEVICE CHECK
Battery Remaining Longevity: 61 mo
Battery Remaining Percentage: 47 %
Battery Voltage: 2.99 V
Brady Statistic AP VP Percent: 1 %
Brady Statistic AP VS Percent: 9.6 %
Brady Statistic AS VP Percent: 1 %
Brady Statistic AS VS Percent: 89 %
Brady Statistic RA Percent Paced: 7.9 %
Brady Statistic RV Percent Paced: 1 %
Date Time Interrogation Session: 20220622020015
Implantable Lead Implant Date: 20160106
Implantable Lead Implant Date: 20160106
Implantable Lead Location: 753859
Implantable Lead Location: 753860
Implantable Pulse Generator Implant Date: 20160106
Lead Channel Impedance Value: 410 Ohm
Lead Channel Impedance Value: 530 Ohm
Lead Channel Pacing Threshold Amplitude: 0.5 V
Lead Channel Pacing Threshold Amplitude: 0.5 V
Lead Channel Pacing Threshold Pulse Width: 0.4 ms
Lead Channel Pacing Threshold Pulse Width: 0.4 ms
Lead Channel Sensing Intrinsic Amplitude: 12 mV
Lead Channel Sensing Intrinsic Amplitude: 4 mV
Lead Channel Setting Pacing Amplitude: 0.75 V
Lead Channel Setting Pacing Amplitude: 2 V
Lead Channel Setting Pacing Pulse Width: 0.4 ms
Lead Channel Setting Sensing Sensitivity: 2 mV
Pulse Gen Model: 2240
Pulse Gen Serial Number: 7705987

## 2020-12-28 NOTE — Progress Notes (Signed)
Remote pacemaker transmission.   

## 2021-03-09 ENCOUNTER — Ambulatory Visit (INDEPENDENT_AMBULATORY_CARE_PROVIDER_SITE_OTHER): Payer: Medicare Other

## 2021-03-09 DIAGNOSIS — I495 Sick sinus syndrome: Secondary | ICD-10-CM | POA: Diagnosis not present

## 2021-03-09 LAB — CUP PACEART REMOTE DEVICE CHECK
Battery Remaining Longevity: 59 mo
Battery Remaining Percentage: 45 %
Battery Voltage: 2.99 V
Brady Statistic AP VP Percent: 1 %
Brady Statistic AP VS Percent: 10 %
Brady Statistic AS VP Percent: 1 %
Brady Statistic AS VS Percent: 88 %
Brady Statistic RA Percent Paced: 8.6 %
Brady Statistic RV Percent Paced: 1 %
Date Time Interrogation Session: 20220921020015
Implantable Lead Implant Date: 20160106
Implantable Lead Implant Date: 20160106
Implantable Lead Location: 753859
Implantable Lead Location: 753860
Implantable Pulse Generator Implant Date: 20160106
Lead Channel Impedance Value: 390 Ohm
Lead Channel Impedance Value: 490 Ohm
Lead Channel Pacing Threshold Amplitude: 0.5 V
Lead Channel Pacing Threshold Amplitude: 0.5 V
Lead Channel Pacing Threshold Pulse Width: 0.4 ms
Lead Channel Pacing Threshold Pulse Width: 0.4 ms
Lead Channel Sensing Intrinsic Amplitude: 1.3 mV
Lead Channel Sensing Intrinsic Amplitude: 12 mV
Lead Channel Setting Pacing Amplitude: 0.75 V
Lead Channel Setting Pacing Amplitude: 2 V
Lead Channel Setting Pacing Pulse Width: 0.4 ms
Lead Channel Setting Sensing Sensitivity: 2 mV
Pulse Gen Model: 2240
Pulse Gen Serial Number: 7705987

## 2021-03-16 NOTE — Progress Notes (Signed)
Remote pacemaker transmission.   

## 2021-04-08 DIAGNOSIS — M25562 Pain in left knee: Secondary | ICD-10-CM | POA: Diagnosis not present

## 2021-04-08 DIAGNOSIS — M546 Pain in thoracic spine: Secondary | ICD-10-CM | POA: Diagnosis not present

## 2021-04-08 DIAGNOSIS — M5451 Vertebrogenic low back pain: Secondary | ICD-10-CM | POA: Diagnosis not present

## 2021-04-08 DIAGNOSIS — M25552 Pain in left hip: Secondary | ICD-10-CM | POA: Diagnosis not present

## 2021-04-12 ENCOUNTER — Other Ambulatory Visit (HOSPITAL_BASED_OUTPATIENT_CLINIC_OR_DEPARTMENT_OTHER): Payer: Self-pay | Admitting: Physician Assistant

## 2021-04-12 ENCOUNTER — Other Ambulatory Visit (HOSPITAL_COMMUNITY): Payer: Self-pay | Admitting: Physician Assistant

## 2021-04-12 DIAGNOSIS — M546 Pain in thoracic spine: Secondary | ICD-10-CM

## 2021-04-12 DIAGNOSIS — M545 Low back pain, unspecified: Secondary | ICD-10-CM

## 2021-04-13 ENCOUNTER — Other Ambulatory Visit: Payer: Self-pay | Admitting: Physical Medicine and Rehabilitation

## 2021-04-13 DIAGNOSIS — M546 Pain in thoracic spine: Secondary | ICD-10-CM

## 2021-04-15 ENCOUNTER — Other Ambulatory Visit: Payer: Self-pay | Admitting: Physical Medicine and Rehabilitation

## 2021-04-15 ENCOUNTER — Ambulatory Visit
Admission: RE | Admit: 2021-04-15 | Discharge: 2021-04-15 | Disposition: A | Discharge status: Home / Self Care | Payer: Medicare Other | Source: Ambulatory Visit | Attending: Physical Medicine and Rehabilitation | Admitting: Physical Medicine and Rehabilitation

## 2021-04-15 DIAGNOSIS — M4317 Spondylolisthesis, lumbosacral region: Secondary | ICD-10-CM | POA: Diagnosis not present

## 2021-04-15 DIAGNOSIS — M545 Low back pain, unspecified: Secondary | ICD-10-CM

## 2021-04-15 DIAGNOSIS — M47814 Spondylosis without myelopathy or radiculopathy, thoracic region: Secondary | ICD-10-CM | POA: Diagnosis not present

## 2021-04-15 DIAGNOSIS — M546 Pain in thoracic spine: Secondary | ICD-10-CM

## 2021-04-15 DIAGNOSIS — M5126 Other intervertebral disc displacement, lumbar region: Secondary | ICD-10-CM | POA: Diagnosis not present

## 2021-05-04 ENCOUNTER — Other Ambulatory Visit: Payer: Self-pay | Admitting: Internal Medicine

## 2021-05-04 DIAGNOSIS — Z1231 Encounter for screening mammogram for malignant neoplasm of breast: Secondary | ICD-10-CM

## 2021-05-05 DIAGNOSIS — M1612 Unilateral primary osteoarthritis, left hip: Secondary | ICD-10-CM | POA: Diagnosis not present

## 2021-05-05 DIAGNOSIS — M25559 Pain in unspecified hip: Secondary | ICD-10-CM | POA: Diagnosis not present

## 2021-05-05 DIAGNOSIS — M1611 Unilateral primary osteoarthritis, right hip: Secondary | ICD-10-CM | POA: Diagnosis not present

## 2021-05-07 ENCOUNTER — Ambulatory Visit
Admission: RE | Admit: 2021-05-07 | Discharge: 2021-05-07 | Disposition: A | Discharge status: Home / Self Care | Payer: Medicare Other | Source: Ambulatory Visit | Attending: Internal Medicine | Admitting: Internal Medicine

## 2021-05-07 ENCOUNTER — Other Ambulatory Visit: Payer: Self-pay

## 2021-05-07 DIAGNOSIS — Z1231 Encounter for screening mammogram for malignant neoplasm of breast: Secondary | ICD-10-CM | POA: Diagnosis not present

## 2021-06-08 ENCOUNTER — Ambulatory Visit (INDEPENDENT_AMBULATORY_CARE_PROVIDER_SITE_OTHER): Payer: Medicare Other

## 2021-06-08 DIAGNOSIS — I495 Sick sinus syndrome: Secondary | ICD-10-CM

## 2021-06-08 LAB — CUP PACEART REMOTE DEVICE CHECK
Battery Remaining Longevity: 56 mo
Battery Remaining Percentage: 43 %
Battery Voltage: 2.99 V
Brady Statistic AP VP Percent: 1 %
Brady Statistic AP VS Percent: 10 %
Brady Statistic AS VP Percent: 1 %
Brady Statistic AS VS Percent: 88 %
Brady Statistic RA Percent Paced: 8.4 %
Brady Statistic RV Percent Paced: 1 %
Date Time Interrogation Session: 20221221020012
Implantable Lead Implant Date: 20160106
Implantable Lead Implant Date: 20160106
Implantable Lead Location: 753859
Implantable Lead Location: 753860
Implantable Pulse Generator Implant Date: 20160106
Lead Channel Impedance Value: 400 Ohm
Lead Channel Impedance Value: 490 Ohm
Lead Channel Pacing Threshold Amplitude: 0.5 V
Lead Channel Pacing Threshold Amplitude: 0.625 V
Lead Channel Pacing Threshold Pulse Width: 0.4 ms
Lead Channel Pacing Threshold Pulse Width: 0.4 ms
Lead Channel Sensing Intrinsic Amplitude: 12 mV
Lead Channel Sensing Intrinsic Amplitude: 3.1 mV
Lead Channel Setting Pacing Amplitude: 0.875
Lead Channel Setting Pacing Amplitude: 2 V
Lead Channel Setting Pacing Pulse Width: 0.4 ms
Lead Channel Setting Sensing Sensitivity: 2 mV
Pulse Gen Model: 2240
Pulse Gen Serial Number: 7705987

## 2021-06-16 DIAGNOSIS — M1612 Unilateral primary osteoarthritis, left hip: Secondary | ICD-10-CM | POA: Diagnosis not present

## 2021-06-17 NOTE — Progress Notes (Signed)
Remote pacemaker transmission.   

## 2021-06-21 DIAGNOSIS — M25552 Pain in left hip: Secondary | ICD-10-CM | POA: Diagnosis not present

## 2021-06-21 DIAGNOSIS — Z8679 Personal history of other diseases of the circulatory system: Secondary | ICD-10-CM | POA: Diagnosis not present

## 2021-06-21 DIAGNOSIS — Z01818 Encounter for other preprocedural examination: Secondary | ICD-10-CM | POA: Diagnosis not present

## 2021-06-21 DIAGNOSIS — Z01812 Encounter for preprocedural laboratory examination: Secondary | ICD-10-CM | POA: Diagnosis not present

## 2021-06-21 DIAGNOSIS — I495 Sick sinus syndrome: Secondary | ICD-10-CM | POA: Diagnosis not present

## 2021-06-21 DIAGNOSIS — M1612 Unilateral primary osteoarthritis, left hip: Secondary | ICD-10-CM | POA: Diagnosis not present

## 2021-06-24 ENCOUNTER — Telehealth: Payer: Self-pay

## 2021-06-24 NOTE — Telephone Encounter (Signed)
Spoke with Vernona Rieger at [facility] [phone] regarding pacemaker recommendations for L. Hip replacement surgery, informed her we do not have a standardized form for surgical procedures she stated she would fax a form over on Monday direct number to device fax.

## 2021-06-26 NOTE — Progress Notes (Signed)
Cardiology Office Note Date:  06/29/2021  Patient ID:  [name], DOB 17-Mar-1946, MRN [id number] PCP:  Sueanne Margarita, DO  Electrophysiologist: Dr. Lovena Le    Chief Complaint:  over due, pre-op  History of Present Illness: [name] is a 76 y.o. female with history of Sinus node dysfunction w/PPM  She comes today to be seen for Dr. Lovena Le, last seen by him 04/23/2019, at that time, doing well, no changes were made.  + remotes (note 1:1 tachy events on remote, known for her)  Looks like pending hip sx RCRI score is zero, 0.4%  TODAY Outside of her hip she feels great She works full time Teaching laboratory technician and is very active depsite her hip, though activities are increasingly painful No exertional incapacities otherwise. Cares for her home, yard and denies any intolerances outside of hip pain No CP, palpitations SOB, DOE No near syncope or syncope. Loves to hike she is very active  Her PMD manages her annual labs lipids checks.   Device information Abbott dual chamber PPM implanted 06/24/2014   Past Medical History:  Diagnosis Date   Dysrhythmia    had 16 sec. of asystole that lead to pacemaker placement   Head injury, acute, with loss of consciousness ([place])    History of syncope    Sinus node dysfunction ([place])    extrinsic   Vaso vagal episode     Past Surgical History:  Procedure Laterality Date   OPEN REDUCTION INTERNAL FIXATION (ORIF) DISTAL RADIAL FRACTURE Right 06/29/2014   Procedure: OPEN REDUCTION INTERNAL FIXATION (ORIF)  RIGHT DISTAL RADIAL FRACTURE;  Surgeon: Leanora Cover, MD;  Location: [place];  Service: Orthopedics;  Laterality: Right;   PERMANENT PACEMAKER INSERTION N/A 06/24/2014   Procedure: PERMANENT PACEMAKER INSERTION;  Surgeon: Evans Lance, MD;  Location: [facility] CATH LAB;  Service: Cardiovascular;  Laterality: N/A;    Current Outpatient Medications  Medication Sig Dispense Refill   Calcium-Magnesium-Vitamin D (CALCIUM MAGNESIUM PO)  Take 1 capsule by mouth daily.     Cholecalciferol (VITAMIN D3) 5000 UNITS CAPS Take 1 capsule by mouth daily.     ibuprofen (ADVIL,MOTRIN) 200 MG tablet Take 400 mg by mouth every 6 (six) hours as needed (pain).      Multiple Vitamin (MULTIVITAMIN) tablet Take 1 tablet by mouth daily as needed (Vitamin supplement).      Omega-3 Fatty Acids (FISH OIL PO) Take 900 mg by mouth daily.     No current facility-administered medications for this visit.    Allergies:   Patient has no known allergies.   Social History:  The patient  reports that she has never smoked. She has never used smokeless tobacco. She reports current alcohol use. She reports that she does not use drugs.   Family History:  The patient's family history includes Breast cancer in her sister; Cancer in her brother and maternal grandmother; Heart disease in her father and maternal grandfather; Stroke in her paternal grandmother.  ROS:  Please see the history of present illness.    All other systems are reviewed and otherwise negative.   PHYSICAL EXAM:  VS:  BP 140/76    Pulse 72    Ht 5\' 3"  (1.6 m)    Wt 165 lb (74.8 kg)    SpO2 97%    BMI 29.23 kg/m  BMI: Body mass index is 29.23 kg/m. Well nourished, well developed, in no acute distress HEENT: normocephalic, atraumatic Neck: no JVD, carotid bruits or masses Cardiac:  RRR; no significant murmurs,  no rubs, or gallops Lungs:  CTA b/l, no wheezing, rhonchi or rales Abd: soft, nontender MS: no deformity or atrophy Ext:  no edema Skin: warm and dry, no rash Neuro:  No gross deficits appreciated Psych: euthymic mood, full affect  PPM site is stable, no tethering or discomfort   EKG: brought with her dated 06/23/21 SR, 72bpm, baseline movement, no acute/ischemic looking changes    Device interrogation done today and reviewed by myself:  Battery and lead measurements are good One brief AT to Aflutter of 6 seconds, all others are/stay 1:1 tachycardia, burden is <1% and no  symptoms reported  Recent Labs: No results found for requested labs within last 8760 hours.  No results found for requested labs within last 8760 hours.   CrCl cannot be calculated (Patient's most recent lab result is older than the maximum 21 days allowed.).   Wt Readings from Last 3 Encounters:  06/29/21 165 lb (74.8 kg)  04/23/19 158 lb (71.7 kg)  11/22/17 168 lb (76.2 kg)     Other studies reviewed: Additional studies/records reviewed today include: summarized above  ASSESSMENT AND PLAN:  PPM Intact function No programming changes made  2. Pre-op Low risk surgery Low cardiac risk score/patient for the procedure planned No cardiac contraindications for the surgery planned Routine per-operative pacer management  Disposition: F/u with remotes as usual, in clinic in a year, sooner if needed  Current medicines are reviewed at length with the patient today.  The patient did not have any concerns regarding medicines.  Venetia Night, PA-C 06/29/2021 8:52 AM     [facility] [place] [address] [phone] (office)  [phone] (fax)

## 2021-06-29 ENCOUNTER — Other Ambulatory Visit: Payer: Self-pay

## 2021-06-29 ENCOUNTER — Encounter: Payer: Self-pay | Admitting: Physician Assistant

## 2021-06-29 ENCOUNTER — Ambulatory Visit: Payer: Medicare Other | Admitting: Physician Assistant

## 2021-06-29 ENCOUNTER — Telehealth: Payer: Self-pay | Admitting: *Deleted

## 2021-06-29 VITALS — BP 140/76 | HR 72 | Ht 63.0 in | Wt 165.0 lb

## 2021-06-29 DIAGNOSIS — Z01818 Encounter for other preprocedural examination: Secondary | ICD-10-CM | POA: Diagnosis not present

## 2021-06-29 DIAGNOSIS — R55 Syncope and collapse: Secondary | ICD-10-CM | POA: Diagnosis not present

## 2021-06-29 DIAGNOSIS — I495 Sick sinus syndrome: Secondary | ICD-10-CM

## 2021-06-29 DIAGNOSIS — Z950 Presence of cardiac pacemaker: Secondary | ICD-10-CM | POA: Diagnosis not present

## 2021-06-29 LAB — CUP PACEART INCLINIC DEVICE CHECK
Battery Remaining Longevity: 58 mo
Battery Voltage: 2.99 V
Brady Statistic RA Percent Paced: 8.2 %
Brady Statistic RV Percent Paced: 0.38 %
Date Time Interrogation Session: 20230111091539
Implantable Lead Implant Date: 20160106
Implantable Lead Implant Date: 20160106
Implantable Lead Location: 753859
Implantable Lead Location: 753860
Implantable Pulse Generator Implant Date: 20160106
Lead Channel Impedance Value: 412.5 Ohm
Lead Channel Impedance Value: 537.5 Ohm
Lead Channel Pacing Threshold Amplitude: 0.5 V
Lead Channel Pacing Threshold Amplitude: 0.5 V
Lead Channel Pacing Threshold Amplitude: 0.625 V
Lead Channel Pacing Threshold Pulse Width: 0.4 ms
Lead Channel Pacing Threshold Pulse Width: 0.4 ms
Lead Channel Pacing Threshold Pulse Width: 0.4 ms
Lead Channel Sensing Intrinsic Amplitude: 12 mV
Lead Channel Sensing Intrinsic Amplitude: 2.9 mV
Lead Channel Setting Pacing Amplitude: 0.875
Lead Channel Setting Pacing Amplitude: 2 V
Lead Channel Setting Pacing Pulse Width: 0.4 ms
Lead Channel Setting Sensing Sensitivity: 2 mV
Pulse Gen Model: 2240
Pulse Gen Serial Number: 7705987

## 2021-06-29 NOTE — Patient Instructions (Signed)

## 2021-06-29 NOTE — Telephone Encounter (Signed)
Pt being seen in the office today stated she is having surgery this Friday 07/01/21. Our office has yet to receive a clearance request. I have pulled the information from the computer for the cardiologist today, in hopes to not delay the pt's surgery. Our office does generally require a clearance request to be faxed to our office for pre op provider review.     Pre-operative Risk Assessment    Patient Name: [name]  DOB: 01/03/1946 MRN: [id number]       Request for Surgical Clearance    Procedure:   ERAS ROBOTIC LEFT TOTAL HIP ARTHROPLASTY ANTERIOR APPROACH  Date of Surgery:  Clearance 07/01/21                                 Surgeon:  DR. Doristine Counter Surgeon's Group or Practice Name:  [facility] Phone number:  [phone] Fax number:  [phone]   Type of Clearance Requested:   - Medical    Type of Anesthesia:  Not Indicated GENERAL?   Additional requests/questions:    Elpidio Anis   06/29/2021, 8:52 AM

## 2021-06-30 NOTE — Telephone Encounter (Signed)
Device clearance faxed to [phone] as requested on form.    Notified Vernona Rieger that fax has been sent.

## 2021-06-30 NOTE — Telephone Encounter (Signed)
[name] from [facility] states they have been faxing the request everyday since Monday and have not received a response. She says the anesthesia is general. Gave fax number: [phone] for her to try again. She states they need the form filled out as soon as possible. Phone: [phone]

## 2021-06-30 NOTE — Telephone Encounter (Signed)
See previous note from [place] with [facility].   I have not received a clearance requesting cardiac clearance. Was only made aware of the pt needing cardiac clearance was when the pt was in the office.   Our office did receive another fax today. The fax today that came over is for the pacemaker clearance. In regard to needing device clearance I will hand this off to the device clinic. Otherwise [name], [facility] did give cardiac clearance yesterday, please those notes that were faxed to your office.   Looks to be that maybe faxes have been intended for the device clinic. I assure you that we strive to do our very best to help provide the best care for our patients. We apologize for any delay.

## 2021-07-01 DIAGNOSIS — Z950 Presence of cardiac pacemaker: Secondary | ICD-10-CM | POA: Diagnosis not present

## 2021-07-01 DIAGNOSIS — Z96642 Presence of left artificial hip joint: Secondary | ICD-10-CM | POA: Diagnosis not present

## 2021-07-01 DIAGNOSIS — M1612 Unilateral primary osteoarthritis, left hip: Secondary | ICD-10-CM | POA: Diagnosis not present

## 2021-07-01 DIAGNOSIS — Z79899 Other long term (current) drug therapy: Secondary | ICD-10-CM | POA: Diagnosis not present

## 2021-07-01 DIAGNOSIS — Z471 Aftercare following joint replacement surgery: Secondary | ICD-10-CM | POA: Diagnosis not present

## 2021-07-02 DIAGNOSIS — Z79899 Other long term (current) drug therapy: Secondary | ICD-10-CM | POA: Diagnosis not present

## 2021-07-02 DIAGNOSIS — M1612 Unilateral primary osteoarthritis, left hip: Secondary | ICD-10-CM | POA: Diagnosis not present

## 2021-07-28 DIAGNOSIS — M1612 Unilateral primary osteoarthritis, left hip: Secondary | ICD-10-CM | POA: Diagnosis not present

## 2021-09-07 ENCOUNTER — Ambulatory Visit (INDEPENDENT_AMBULATORY_CARE_PROVIDER_SITE_OTHER): Payer: Medicare Other

## 2021-09-07 DIAGNOSIS — I495 Sick sinus syndrome: Secondary | ICD-10-CM | POA: Diagnosis not present

## 2021-09-08 LAB — CUP PACEART REMOTE DEVICE CHECK
Battery Remaining Longevity: 53 mo
Battery Remaining Percentage: 40 %
Battery Voltage: 2.98 V
Brady Statistic AP VP Percent: 1 %
Brady Statistic AP VS Percent: 2.5 %
Brady Statistic AS VP Percent: 1 %
Brady Statistic AS VS Percent: 97 %
Brady Statistic RA Percent Paced: 2.3 %
Brady Statistic RV Percent Paced: 1 %
Date Time Interrogation Session: 20230322033853
Implantable Lead Implant Date: 20160106
Implantable Lead Implant Date: 20160106
Implantable Lead Location: 753859
Implantable Lead Location: 753860
Implantable Pulse Generator Implant Date: 20160106
Lead Channel Impedance Value: 400 Ohm
Lead Channel Impedance Value: 510 Ohm
Lead Channel Pacing Threshold Amplitude: 0.5 V
Lead Channel Pacing Threshold Amplitude: 0.5 V
Lead Channel Pacing Threshold Pulse Width: 0.4 ms
Lead Channel Pacing Threshold Pulse Width: 0.4 ms
Lead Channel Sensing Intrinsic Amplitude: 1.5 mV
Lead Channel Sensing Intrinsic Amplitude: 12 mV
Lead Channel Setting Pacing Amplitude: 0.75 V
Lead Channel Setting Pacing Amplitude: 2 V
Lead Channel Setting Pacing Pulse Width: 0.4 ms
Lead Channel Setting Sensing Sensitivity: 2 mV
Pulse Gen Model: 2240
Pulse Gen Serial Number: 7705987

## 2021-09-20 NOTE — Progress Notes (Signed)
Remote pacemaker transmission.   

## 2021-10-27 DIAGNOSIS — I495 Sick sinus syndrome: Secondary | ICD-10-CM | POA: Diagnosis not present

## 2021-10-27 DIAGNOSIS — R531 Weakness: Secondary | ICD-10-CM | POA: Diagnosis not present

## 2021-10-27 DIAGNOSIS — Z8781 Personal history of (healed) traumatic fracture: Secondary | ICD-10-CM | POA: Diagnosis not present

## 2021-10-27 DIAGNOSIS — Z780 Asymptomatic menopausal state: Secondary | ICD-10-CM | POA: Diagnosis not present

## 2021-12-07 ENCOUNTER — Ambulatory Visit (INDEPENDENT_AMBULATORY_CARE_PROVIDER_SITE_OTHER): Payer: Medicare Other

## 2021-12-07 DIAGNOSIS — I495 Sick sinus syndrome: Secondary | ICD-10-CM

## 2021-12-08 LAB — CUP PACEART REMOTE DEVICE CHECK
Battery Remaining Longevity: 50 mo
Battery Remaining Percentage: 38 %
Battery Voltage: 2.98 V
Brady Statistic AP VP Percent: 1 %
Brady Statistic AP VS Percent: 5.2 %
Brady Statistic AS VP Percent: 1 %
Brady Statistic AS VS Percent: 95 %
Brady Statistic RA Percent Paced: 4.8 %
Brady Statistic RV Percent Paced: 1 %
Date Time Interrogation Session: 20230621020030
Implantable Lead Implant Date: 20160106
Implantable Lead Implant Date: 20160106
Implantable Lead Location: 753859
Implantable Lead Location: 753860
Implantable Pulse Generator Implant Date: 20160106
Lead Channel Impedance Value: 400 Ohm
Lead Channel Impedance Value: 510 Ohm
Lead Channel Pacing Threshold Amplitude: 0.5 V
Lead Channel Pacing Threshold Amplitude: 0.5 V
Lead Channel Pacing Threshold Pulse Width: 0.4 ms
Lead Channel Pacing Threshold Pulse Width: 0.4 ms
Lead Channel Sensing Intrinsic Amplitude: 1.7 mV
Lead Channel Sensing Intrinsic Amplitude: 12 mV
Lead Channel Setting Pacing Amplitude: 0.75 V
Lead Channel Setting Pacing Amplitude: 2 V
Lead Channel Setting Pacing Pulse Width: 0.4 ms
Lead Channel Setting Sensing Sensitivity: 2 mV
Pulse Gen Model: 2240
Pulse Gen Serial Number: 7705987

## 2021-12-15 DIAGNOSIS — M25551 Pain in right hip: Secondary | ICD-10-CM | POA: Diagnosis not present

## 2021-12-15 DIAGNOSIS — M1611 Unilateral primary osteoarthritis, right hip: Secondary | ICD-10-CM | POA: Diagnosis not present

## 2021-12-19 NOTE — Progress Notes (Signed)
Remote pacemaker transmission.   

## 2021-12-26 ENCOUNTER — Encounter (HOSPITAL_BASED_OUTPATIENT_CLINIC_OR_DEPARTMENT_OTHER): Payer: Self-pay

## 2021-12-26 ENCOUNTER — Telehealth: Payer: Self-pay | Admitting: Internal Medicine

## 2021-12-26 ENCOUNTER — Emergency Department (HOSPITAL_BASED_OUTPATIENT_CLINIC_OR_DEPARTMENT_OTHER)
Admission: EM | Admit: 2021-12-26 | Discharge: 2021-12-26 | Disposition: A | Discharge status: Home / Self Care | Payer: Medicare Other | Attending: Emergency Medicine | Admitting: Emergency Medicine

## 2021-12-26 ENCOUNTER — Emergency Department (HOSPITAL_BASED_OUTPATIENT_CLINIC_OR_DEPARTMENT_OTHER): Payer: Medicare Other | Admitting: Radiology

## 2021-12-26 DIAGNOSIS — R079 Chest pain, unspecified: Secondary | ICD-10-CM | POA: Diagnosis not present

## 2021-12-26 DIAGNOSIS — R0789 Other chest pain: Secondary | ICD-10-CM | POA: Diagnosis not present

## 2021-12-26 LAB — TROPONIN I (HIGH SENSITIVITY)
Troponin I (High Sensitivity): <2 ng/L (ref <18)
Troponin I (High Sensitivity): <2 ng/L (ref <18)

## 2021-12-26 LAB — CBC
HCT: 39.8 % (ref 36.0–46.0)
Hemoglobin: 13.5 g/dL (ref 12.0–15.0)
MCH: 29.9 pg (ref 26.0–34.0)
MCHC: 33.9 g/dL (ref 30.0–36.0)
MCV: 88.1 fL (ref 80.0–100.0)
Platelets: 278 10*3/uL (ref 150–400)
RBC: 4.52 MIL/uL (ref 3.87–5.11)
RDW: 14.4 % (ref 11.5–15.5)
WBC: 5.9 10*3/uL (ref 4.0–10.5)
nRBC: 0 % (ref 0.0–0.2)

## 2021-12-26 LAB — BASIC METABOLIC PANEL
Anion gap: 14 (ref 5–15)
BUN: 20 mg/dL (ref 8–23)
CO2: 23 mmol/L (ref 22–32)
Calcium: 9.8 mg/dL (ref 8.9–10.3)
Chloride: 99 mmol/L (ref 98–111)
Creatinine, Ser: 0.99 mg/dL (ref 0.44–1.00)
GFR, Estimated: >60 mL/min (ref >60)
Glucose, Bld: 104 mg/dL — ABNORMAL HIGH (ref 70–99)
Potassium: 4 mmol/L (ref 3.5–5.1)
Sodium: 136 mmol/L (ref 135–145)

## 2021-12-26 NOTE — Discharge Instructions (Addendum)
Your x-ray and cardiac tests were all normal today.  Please follow-up with your cardiologist this week.  They may want to pursue additional stress testing of your heart.  Return back to the ER if you have worsening chest pain difficulty breathing or any additional concerns.

## 2021-12-26 NOTE — Telephone Encounter (Signed)
Successful telephone encounter to patient to assist with sending a manual transmission for review as she is having symptoms of chest tightness, lightheadedness, and tingling in arm. Transmission reviewed with normal device function and no concerning episodes. Known HVR 1:1 SVT. Patient states she has had approximately 5 episodes in the last 24 hours of chest tightness that begins as nausea without vomiting, followed by chest tightness, lightheadedness, and tingling sensation in left arm. These episodes last approximately 1 min to 90 seconds. Patient has no history of CAD. Advised patient she needs to seek ED care to rule out cardiac concerns. Patient states she did not know her PPM could NOT detect heart attack or stroke. Patient verbalized understanding.

## 2021-12-26 NOTE — Telephone Encounter (Signed)
   Pre-operative Risk Assessment    Patient Name: [name]  DOB: June 03, 1946 MRN: [id number]      Request for Surgical Clearance    Procedure:   Right hip replacement  Date of Surgery:  Clearance 02/08/22                                 Surgeon:  Dr. Doristine Counter Surgeon's Group or Practice Name:  [facility] Phone number:  [phone] Fax number:     Type of Clearance Requested:   - Medical    Type of Anesthesia:  Spinal   Additional requests/questions:       SignedAnnetta Maw   12/26/2021, 12:52 PM

## 2021-12-26 NOTE — ED Provider Notes (Signed)
[facility] EMERGENCY DEPT Provider Note   CSN: [id number] Arrival date & time: 12/26/21  1452     History  Chief Complaint  Patient presents with   Chest Pain    [name] is a 76 y.o. female.  Patient presents to ER chief complaint of chest pain.  Is been going on and off for the past 2 days.  Last maybe 2 minutes at the most she states.  Occurs at rest.  She noticed it might radiate to her left arm.  Currently denies any pain last episode was several hours ago.  She called her cardiologist who advised her to go to the ER.  She does have a pacemaker which was interrogated by her cardiologist earlier today and was told that it was normal.  Otherwise denies any fevers or cough or vomiting or diarrhea.       Home Medications Prior to Admission medications   Medication Sig Start Date End Date Taking? Authorizing Provider  Calcium-Magnesium-Vitamin D (CALCIUM MAGNESIUM PO) Take 1 capsule by mouth daily.    [provider]  Cholecalciferol (VITAMIN D3) 5000 UNITS CAPS Take 1 capsule by mouth daily.    [provider]  ibuprofen (ADVIL,MOTRIN) 200 MG tablet Take 400 mg by mouth every 6 (six) hours as needed (pain).     [provider]  Multiple Vitamin (MULTIVITAMIN) tablet Take 1 tablet by mouth daily as needed (Vitamin supplement).     [provider]  Omega-3 Fatty Acids (FISH OIL PO) Take 900 mg by mouth daily.    [provider]      Allergies    Patient has no known allergies.    Review of Systems   Review of Systems  Constitutional:  Negative for fever.  HENT:  Negative for ear pain.   Eyes:  Negative for pain.  Respiratory:  Negative for cough.   Cardiovascular:  Positive for chest pain.  Gastrointestinal:  Negative for abdominal pain.  Genitourinary:  Negative for flank pain.  Musculoskeletal:  Negative for back pain.  Skin:  Negative for rash.  Neurological:  Negative for headaches.    Physical  Exam Updated Vital Signs BP 138/69   Pulse 68   Temp 98.1 F (36.7 C)   Resp 17   Ht 5\' 3"  (1.6 m)   Wt 71.2 kg   SpO2 98%   BMI 27.81 kg/m  Physical Exam Constitutional:      General: She is not in acute distress.    Appearance: Normal appearance.  HENT:     Head: Normocephalic.     Nose: Nose normal.  Eyes:     Extraocular Movements: Extraocular movements intact.  Cardiovascular:     Rate and Rhythm: Normal rate.  Pulmonary:     Effort: Pulmonary effort is normal.  Abdominal:     General: There is no distension.     Tenderness: There is no abdominal tenderness. There is no guarding.  Musculoskeletal:        General: Normal range of motion.     Cervical back: Normal range of motion.  Neurological:     General: No focal deficit present.     Mental Status: She is alert. Mental status is at baseline.     ED Results / Procedures / Treatments   Labs (all labs ordered are listed, but only abnormal results are displayed) Labs Reviewed  BASIC METABOLIC PANEL - Abnormal; Notable for the following components:      Result Value  Glucose, Bld 104 (*)    All other components within normal limits  CBC  TROPONIN I (HIGH SENSITIVITY)  TROPONIN I (HIGH SENSITIVITY)    EKG EKG Interpretation  Date/Time:  Monday December 26 2021 15:04:21 EDT Ventricular Rate:  72 PR Interval:  198 QRS Duration: 70 QT Interval:  398 QTC Calculation: 435 R Axis:   70 Text Interpretation: Normal sinus rhythm Normal ECG When compared with ECG of 25-Jun-2014 06:37, No significant change was found Confirmed by Norman Clay ([id number]) on 12/26/2021 3:20:28 PM  Radiology DG Chest 2 View  Result Date: 12/26/2021 CLINICAL DATA:  Chest pain radiating into the neck EXAM: CHEST - 2 VIEW COMPARISON:  06/25/2014 FINDINGS: No focal consolidation. No pleural effusion or pneumothorax. Heart and mediastinal contours are unremarkable. Dual lead cardiac pacemaker. No acute osseous abnormality. IMPRESSION: No active  cardiopulmonary disease. Electronically Signed   By: Elige Ko M.D.   On: 12/26/2021 15:49    Procedures Procedures    Medications Ordered in ED Medications - No data to display  ED Course/ Medical Decision Making/ A&P                           Medical Decision Making Amount and/or Complexity of Data Reviewed Labs: ordered. Radiology: ordered.   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  Cardiac monitoring showing sinus rhythm.  10 exercise includes labs, troponins are flat x2.  Chest x-ray is unremarkable no infiltrate effusion or edema noted.  Patient remains asymptomatic without pain or discomfort.  Given history and work-up today doubt acute coronary syndrome.  We will recommend she follow-up with her cardiologist this week.  Recommending immediate return for worsening symptoms, persistent chest pain, or any additional concerns.        Final Clinical Impression(s) / ED Diagnoses Final diagnoses:  Chest pain, unspecified type    Rx / DC Orders ED Discharge Orders     None         Cheryll Cockayne, MD 12/26/21 [phone]

## 2021-12-26 NOTE — ED Triage Notes (Signed)
Pt c/o intermittent chest pain that will radiate to her left arm for the past 2 days.

## 2021-12-26 NOTE — Telephone Encounter (Signed)
Patient c/o Palpitations:  High priority if patient c/o lightheadedness, shortness of breath, or chest pain  How long have you had palpitations/irregular HR/ Afib? Are you having the symptoms now? Now  Are you currently experiencing lightheadedness, SOB or CP? Tightness in chest.  Befuddled for a moment  Do you have a history of afib (atrial fibrillation) or irregular heart rhythm?   No  Have you checked your BP or HR? (document readings if available):   No         Are you experiencing any other symptoms?  Tingling in her left arm  Patient stated she had 4-5 brief episodes (10-15 seconds) over the past two days.

## 2021-12-27 ENCOUNTER — Telehealth: Payer: Self-pay | Admitting: *Deleted

## 2021-12-27 NOTE — Telephone Encounter (Signed)
Primary Cardiologist:None   Preoperative team, please contact this patient and set up a phone call appointment for further preoperative risk assessment. Please obtain consent and complete medication review. Thank you for your help.   She was in the ED on 12/26/21 and was advised to follow-up with cardiology this week.   I confirm that guidance regarding antiplatelet and oral anticoagulation therapy has been completed and, if necessary, noted below.   Levi Aland, NP-C    12/27/2021, 8:18 AM [facility] [address], [address] Office [phone] Fax [phone]

## 2021-12-27 NOTE — Telephone Encounter (Signed)
Pt agreeable to plan of care for tele pre op visit 01/02/22 @ 9 am. Med rec and consent are done.    Patient Consent for Virtual Visit        [name] has provided verbal consent on 12/27/2021 for a virtual visit (video or telephone).   CONSENT FOR VIRTUAL VISIT FOR:  [name]  By participating in this virtual visit I agree to the following:  I hereby voluntarily request, consent and authorize [facility] and its employed or contracted physicians, physician assistants, nurse practitioners or other licensed health care professionals (the Practitioner), to provide me with telemedicine health care services (the "Services") as deemed necessary by the treating Practitioner. I acknowledge and consent to receive the Services by the Practitioner via telemedicine. I understand that the telemedicine visit will involve communicating with the Practitioner through live audiovisual communication technology and the disclosure of certain medical information by electronic transmission. I acknowledge that I have been given the opportunity to request an in-person assessment or other available alternative prior to the telemedicine visit and am voluntarily participating in the telemedicine visit.  I understand that I have the right to withhold or withdraw my consent to the use of telemedicine in the course of my care at any time, without affecting my right to future care or treatment, and that the Practitioner or I may terminate the telemedicine visit at any time. I understand that I have the right to inspect all information obtained and/or recorded in the course of the telemedicine visit and may receive copies of available information for a reasonable fee.  I understand that some of the potential risks of receiving the Services via telemedicine include:  Delay or interruption in medical evaluation due to technological equipment failure or disruption; Information transmitted may not be sufficient (e.g.  poor resolution of images) to allow for appropriate medical decision making by the Practitioner; and/or  In rare instances, security protocols could fail, causing a breach of personal health information.  Furthermore, I acknowledge that it is my responsibility to provide information about my medical history, conditions and care that is complete and accurate to the best of my ability. I acknowledge that Practitioner's advice, recommendations, and/or decision may be based on factors not within their control, such as incomplete or inaccurate data provided by me or distortions of diagnostic images or specimens that may result from electronic transmissions. I understand that the practice of medicine is not an exact science and that Practitioner makes no warranties or guarantees regarding treatment outcomes. I acknowledge that a copy of this consent can be made available to me via my patient portal [facility] MyChart), or I can request a printed copy by calling the office of [facility].    I understand that my insurance will be billed for this visit.   I have read or had this consent read to me. I understand the contents of this consent, which adequately explains the benefits and risks of the Services being provided via telemedicine.  I have been provided ample opportunity to ask questions regarding this consent and the Services and have had my questions answered to my satisfaction. I give my informed consent for the services to be provided through the use of telemedicine in my medical care

## 2021-12-27 NOTE — Telephone Encounter (Signed)
Pt agreeable to plan of care for tele pre op visit 01/02/22 @ 9 am. Med rec and consent are done.

## 2022-01-02 ENCOUNTER — Ambulatory Visit (INDEPENDENT_AMBULATORY_CARE_PROVIDER_SITE_OTHER): Payer: Medicare Other | Admitting: Physician Assistant

## 2022-01-02 DIAGNOSIS — R079 Chest pain, unspecified: Secondary | ICD-10-CM

## 2022-01-02 DIAGNOSIS — Z01810 Encounter for preprocedural cardiovascular examination: Secondary | ICD-10-CM | POA: Diagnosis not present

## 2022-01-02 NOTE — Progress Notes (Signed)
Virtual Visit via Telephone Note   Because of [name]'s co-morbid illnesses, she is at least at moderate risk for complications without adequate follow up.  This format is felt to be most appropriate for this patient at this time.  The patient did not have access to video technology/had technical difficulties with video requiring transitioning to audio format only (telephone).  All issues noted in this document were discussed and addressed.  No physical exam could be performed with this format.  Please refer to the patient's chart for her consent to telehealth for [name].  Evaluation Performed:  Preoperative cardiovascular risk assessment  Date:  01/02/2022   Patient ID:  [name], DOB September 02, 1945, MRN [id number] Patient Location:  Home Provider location:   Office  Primary Care Provider:  Lanier Prude, MD Primary Cardiologist:  Mary Bunting, MD  Chief Complaint / Patient Profile   76 y.o. y/o female with a h/o sinus node dysfunction s/p permanent pacemaker who is pending right hip replacement and presents today for telephonic preoperative cardiovascular risk assessment.  Past Medical History    Past Medical History:  Diagnosis Date   Dysrhythmia    had 16 sec. of asystole that lead to pacemaker placement   Head injury, acute, with loss of consciousness (HCC)    History of syncope    Sinus node dysfunction (HCC)    extrinsic   Vaso vagal episode    Past Surgical History:  Procedure Laterality Date   OPEN REDUCTION INTERNAL FIXATION (ORIF) DISTAL RADIAL FRACTURE Right 06/29/2014   Procedure: OPEN REDUCTION INTERNAL FIXATION (ORIF)  RIGHT DISTAL RADIAL FRACTURE;  Surgeon: Mary Loa, MD;  Location: [facility] OR;  Service: Orthopedics;  Laterality: Right;   PERMANENT PACEMAKER INSERTION N/A 06/24/2014   Procedure: PERMANENT PACEMAKER INSERTION;  Surgeon: Mary Maw, MD;  Location: [facility] CATH LAB;  Service: Cardiovascular;  Laterality: N/A;    Allergies  No  Known Allergies  History of Present Illness    [name] is a 76 y.o. female who presents via audio/video conferencing for a telehealth visit today.  Pt was last seen in cardiology clinic on 06/29/21 by [name], PAC.  At that time [name] was doing well .  The patient is now pending procedure as outlined above. Since her last visit, she was seen in ER 12/26/21 for chest pain.  Troponin negative.  EKG without acute ischemic changes.  Chest x-ray clear.  Sent home from ER.  Now seen virtually for surgical clearance. Patient reported she was at church and had sharp chest pain without fluttering sensation, shortness of breath or diaphoresis.  It lasted for 30 seconds with self resolution.  She had a recurrent episode lasting for  less than 1 minute few hours later and went to ER.  I have reviewed records personally.  It was reassuring.  Since then, patient denies any recurrent episode.  She is very active at baseline.  She manages multiple properties.  She does exercise 3 days/week.  Does swimming.  She continues to do this activity after ER visit without any problem.  Yesterday she did lawnmowing and weed eating without any issue.  She denies shortness of breath, dizziness, orthopnea, PND, syncope, lower extremity edema or melena.  She does not think there was any significant heart issue.  She will let us know if any recurrent symptoms.    Home Medications    Prior to Admission medications   Medication Sig Start Date End Date Taking? Authorizing Provider  Calcium-Magnesium-Vitamin D (CALCIUM MAGNESIUM PO) Take 1 capsule by mouth daily.    [provider]  Cholecalciferol (VITAMIN D3) 5000 UNITS CAPS Take 1 capsule by mouth daily.    [provider]  ibuprofen (ADVIL,MOTRIN) 200 MG tablet Take 400 mg by mouth every 6 (six) hours as needed (pain).     [provider]  Multiple Vitamin (MULTIVITAMIN) tablet Take 1 tablet by mouth daily as needed (Vitamin  supplement).     [provider]  Omega-3 Fatty Acids (FISH OIL PO) Take 900 mg by mouth daily.    [provider]    Physical Exam    Vital Signs:  [name] does not have vital signs available for review today.  Given telephonic nature of communication, physical exam is limited. AAOx3. NAD. Normal affect.  Speech and respirations are unlabored.  Accessory Clinical Findings    None  Assessment & Plan    1.  Preoperative Cardiovascular Risk Assessment:  Given past medical history and time since last visit, based on ACC/AHA guidelines, [name] would be at acceptable risk for the planned procedure without further cardiovascular testing.   The patient was advised that if she develops new symptoms prior to surgery to contact our office to arrange for a follow-up visit, and she verbalized understanding.  I will route this recommendation to the requesting party via Epic fax function and remove from pre-op pool.  Please call with questions.  A copy of this note will be routed to requesting surgeon.  2. Chest pain  Sounds atypical without classic angina.  Work-up in the ER reassuring.  She is not in acute baseline.  No exertional limitation or fatigue.  She will let us know if recurrent symptoms.  Time:   Today, I have spent 15 minutes with the patient with telehealth technology discussing medical history, symptoms, and management plan.     [name], [place]  01/02/2022, 9:19 AM

## 2022-01-25 NOTE — Telephone Encounter (Signed)
Requesting office sent over a duplicate clearance. Pt was cleared  by cardiology on 01/02/22. I will re-fax the clearance notes. I have handed off what seems a form for device clinic for clearance.

## 2022-01-26 DIAGNOSIS — Z01810 Encounter for preprocedural cardiovascular examination: Secondary | ICD-10-CM | POA: Diagnosis not present

## 2022-01-26 DIAGNOSIS — Z8679 Personal history of other diseases of the circulatory system: Secondary | ICD-10-CM | POA: Diagnosis not present

## 2022-01-26 DIAGNOSIS — Z01818 Encounter for other preprocedural examination: Secondary | ICD-10-CM | POA: Diagnosis not present

## 2022-01-26 DIAGNOSIS — I495 Sick sinus syndrome: Secondary | ICD-10-CM | POA: Diagnosis not present

## 2022-01-26 DIAGNOSIS — M1611 Unilateral primary osteoarthritis, right hip: Secondary | ICD-10-CM | POA: Diagnosis not present

## 2022-02-08 DIAGNOSIS — Z961 Presence of intraocular lens: Secondary | ICD-10-CM | POA: Diagnosis not present

## 2022-02-08 DIAGNOSIS — M160 Bilateral primary osteoarthritis of hip: Secondary | ICD-10-CM | POA: Diagnosis not present

## 2022-02-08 DIAGNOSIS — Z96642 Presence of left artificial hip joint: Secondary | ICD-10-CM | POA: Diagnosis not present

## 2022-02-08 DIAGNOSIS — M1611 Unilateral primary osteoarthritis, right hip: Secondary | ICD-10-CM | POA: Diagnosis not present

## 2022-02-08 DIAGNOSIS — Z9842 Cataract extraction status, left eye: Secondary | ICD-10-CM | POA: Diagnosis not present

## 2022-02-08 DIAGNOSIS — Z79899 Other long term (current) drug therapy: Secondary | ICD-10-CM | POA: Diagnosis not present

## 2022-02-08 DIAGNOSIS — N189 Chronic kidney disease, unspecified: Secondary | ICD-10-CM | POA: Diagnosis not present

## 2022-02-08 DIAGNOSIS — Z950 Presence of cardiac pacemaker: Secondary | ICD-10-CM | POA: Diagnosis not present

## 2022-02-08 DIAGNOSIS — I499 Cardiac arrhythmia, unspecified: Secondary | ICD-10-CM | POA: Diagnosis not present

## 2022-02-08 DIAGNOSIS — Z9841 Cataract extraction status, right eye: Secondary | ICD-10-CM | POA: Diagnosis not present

## 2022-02-08 DIAGNOSIS — I495 Sick sinus syndrome: Secondary | ICD-10-CM | POA: Diagnosis not present

## 2022-02-08 DIAGNOSIS — Z471 Aftercare following joint replacement surgery: Secondary | ICD-10-CM | POA: Diagnosis not present

## 2022-02-08 DIAGNOSIS — Z96641 Presence of right artificial hip joint: Secondary | ICD-10-CM | POA: Diagnosis not present

## 2022-02-09 DIAGNOSIS — Z9842 Cataract extraction status, left eye: Secondary | ICD-10-CM | POA: Diagnosis not present

## 2022-02-09 DIAGNOSIS — Z961 Presence of intraocular lens: Secondary | ICD-10-CM | POA: Diagnosis not present

## 2022-02-09 DIAGNOSIS — M160 Bilateral primary osteoarthritis of hip: Secondary | ICD-10-CM | POA: Diagnosis not present

## 2022-02-09 DIAGNOSIS — Z9841 Cataract extraction status, right eye: Secondary | ICD-10-CM | POA: Diagnosis not present

## 2022-02-09 DIAGNOSIS — Z79899 Other long term (current) drug therapy: Secondary | ICD-10-CM | POA: Diagnosis not present

## 2022-02-09 DIAGNOSIS — Z950 Presence of cardiac pacemaker: Secondary | ICD-10-CM | POA: Diagnosis not present

## 2022-02-09 DIAGNOSIS — N189 Chronic kidney disease, unspecified: Secondary | ICD-10-CM | POA: Diagnosis not present

## 2022-02-09 DIAGNOSIS — I495 Sick sinus syndrome: Secondary | ICD-10-CM | POA: Diagnosis not present

## 2022-02-09 DIAGNOSIS — I499 Cardiac arrhythmia, unspecified: Secondary | ICD-10-CM | POA: Diagnosis not present

## 2022-02-09 DIAGNOSIS — Z96642 Presence of left artificial hip joint: Secondary | ICD-10-CM | POA: Diagnosis not present

## 2022-03-08 ENCOUNTER — Ambulatory Visit (INDEPENDENT_AMBULATORY_CARE_PROVIDER_SITE_OTHER): Payer: Medicare Other

## 2022-03-08 DIAGNOSIS — I495 Sick sinus syndrome: Secondary | ICD-10-CM | POA: Diagnosis not present

## 2022-03-08 DIAGNOSIS — M1611 Unilateral primary osteoarthritis, right hip: Secondary | ICD-10-CM | POA: Diagnosis not present

## 2022-03-08 LAB — CUP PACEART REMOTE DEVICE CHECK
Battery Remaining Longevity: 47 mo
Battery Remaining Percentage: 36 %
Battery Voltage: 2.98 V
Brady Statistic AP VP Percent: 1 %
Brady Statistic AP VS Percent: 7.9 %
Brady Statistic AS VP Percent: 1 %
Brady Statistic AS VS Percent: 91 %
Brady Statistic RA Percent Paced: 7 %
Brady Statistic RV Percent Paced: 1 %
Date Time Interrogation Session: 20230920025956
Implantable Lead Implant Date: 20160106
Implantable Lead Implant Date: 20160106
Implantable Lead Location: 753859
Implantable Lead Location: 753860
Implantable Pulse Generator Implant Date: 20160106
Lead Channel Impedance Value: 380 Ohm
Lead Channel Impedance Value: 480 Ohm
Lead Channel Pacing Threshold Amplitude: 0.5 V
Lead Channel Pacing Threshold Amplitude: 0.5 V
Lead Channel Pacing Threshold Pulse Width: 0.4 ms
Lead Channel Pacing Threshold Pulse Width: 0.4 ms
Lead Channel Sensing Intrinsic Amplitude: 1.5 mV
Lead Channel Sensing Intrinsic Amplitude: 12 mV
Lead Channel Setting Pacing Amplitude: 0.75 V
Lead Channel Setting Pacing Amplitude: 2 V
Lead Channel Setting Pacing Pulse Width: 0.4 ms
Lead Channel Setting Sensing Sensitivity: 2 mV
Pulse Gen Model: 2240
Pulse Gen Serial Number: 7705987

## 2022-03-21 NOTE — Progress Notes (Signed)
Remote pacemaker transmission.   

## 2022-03-23 DIAGNOSIS — M25552 Pain in left hip: Secondary | ICD-10-CM | POA: Diagnosis not present

## 2022-03-23 DIAGNOSIS — M25551 Pain in right hip: Secondary | ICD-10-CM | POA: Diagnosis not present

## 2022-04-25 DIAGNOSIS — E538 Deficiency of other specified B group vitamins: Secondary | ICD-10-CM | POA: Diagnosis not present

## 2022-04-25 DIAGNOSIS — R7989 Other specified abnormal findings of blood chemistry: Secondary | ICD-10-CM | POA: Diagnosis not present

## 2022-04-25 DIAGNOSIS — R531 Weakness: Secondary | ICD-10-CM | POA: Diagnosis not present

## 2022-04-25 DIAGNOSIS — E559 Vitamin D deficiency, unspecified: Secondary | ICD-10-CM | POA: Diagnosis not present

## 2022-05-02 ENCOUNTER — Other Ambulatory Visit: Payer: Self-pay | Admitting: Internal Medicine

## 2022-05-02 DIAGNOSIS — Z0000 Encounter for general adult medical examination without abnormal findings: Secondary | ICD-10-CM | POA: Diagnosis not present

## 2022-05-02 DIAGNOSIS — Z8781 Personal history of (healed) traumatic fracture: Secondary | ICD-10-CM | POA: Diagnosis not present

## 2022-05-02 DIAGNOSIS — E7849 Other hyperlipidemia: Secondary | ICD-10-CM

## 2022-05-02 DIAGNOSIS — I495 Sick sinus syndrome: Secondary | ICD-10-CM | POA: Diagnosis not present

## 2022-05-03 ENCOUNTER — Other Ambulatory Visit: Payer: Self-pay | Admitting: Internal Medicine

## 2022-05-03 DIAGNOSIS — Z1231 Encounter for screening mammogram for malignant neoplasm of breast: Secondary | ICD-10-CM

## 2022-05-15 DIAGNOSIS — Z961 Presence of intraocular lens: Secondary | ICD-10-CM | POA: Diagnosis not present

## 2022-05-15 DIAGNOSIS — H18513 Endothelial corneal dystrophy, bilateral: Secondary | ICD-10-CM | POA: Diagnosis not present

## 2022-06-07 ENCOUNTER — Ambulatory Visit (INDEPENDENT_AMBULATORY_CARE_PROVIDER_SITE_OTHER): Payer: Medicare Other

## 2022-06-07 DIAGNOSIS — I495 Sick sinus syndrome: Secondary | ICD-10-CM | POA: Diagnosis not present

## 2022-06-07 LAB — CUP PACEART REMOTE DEVICE CHECK
Battery Remaining Longevity: 45 mo
Battery Remaining Percentage: 34 %
Battery Voltage: 2.98 V
Brady Statistic AP VP Percent: 1 %
Brady Statistic AP VS Percent: 8.7 %
Brady Statistic AS VP Percent: 1 %
Brady Statistic AS VS Percent: 90 %
Brady Statistic RA Percent Paced: 7.1 %
Brady Statistic RV Percent Paced: 1 %
Date Time Interrogation Session: 20231220020029
Implantable Lead Connection Status: 753985
Implantable Lead Connection Status: 753985
Implantable Lead Implant Date: 20160106
Implantable Lead Implant Date: 20160106
Implantable Lead Location: 753859
Implantable Lead Location: 753860
Implantable Pulse Generator Implant Date: 20160106
Lead Channel Impedance Value: 390 Ohm
Lead Channel Impedance Value: 480 Ohm
Lead Channel Pacing Threshold Amplitude: 0.5 V
Lead Channel Pacing Threshold Amplitude: 0.625 V
Lead Channel Pacing Threshold Pulse Width: 0.4 ms
Lead Channel Pacing Threshold Pulse Width: 0.4 ms
Lead Channel Sensing Intrinsic Amplitude: 1.4 mV
Lead Channel Sensing Intrinsic Amplitude: 12 mV
Lead Channel Setting Pacing Amplitude: 0.875
Lead Channel Setting Pacing Amplitude: 2 V
Lead Channel Setting Pacing Pulse Width: 0.4 ms
Lead Channel Setting Sensing Sensitivity: 2 mV
Pulse Gen Model: 2240
Pulse Gen Serial Number: 7705987

## 2022-06-08 ENCOUNTER — Ambulatory Visit
Admission: RE | Admit: 2022-06-08 | Discharge: 2022-06-08 | Disposition: A | Discharge status: Home / Self Care | Payer: No Typology Code available for payment source | Source: Ambulatory Visit | Attending: Internal Medicine | Admitting: Internal Medicine

## 2022-06-08 DIAGNOSIS — E7849 Other hyperlipidemia: Secondary | ICD-10-CM

## 2022-06-20 ENCOUNTER — Ambulatory Visit
Admission: RE | Admit: 2022-06-20 | Discharge: 2022-06-20 | Disposition: A | Discharge status: Home / Self Care | Payer: Medicare Other | Source: Ambulatory Visit | Attending: Internal Medicine | Admitting: Internal Medicine

## 2022-06-20 DIAGNOSIS — Z1231 Encounter for screening mammogram for malignant neoplasm of breast: Secondary | ICD-10-CM | POA: Diagnosis not present

## 2022-07-03 NOTE — Progress Notes (Signed)
Remote pacemaker transmission.   

## 2022-09-06 ENCOUNTER — Ambulatory Visit (INDEPENDENT_AMBULATORY_CARE_PROVIDER_SITE_OTHER): Payer: Medicare Other

## 2022-09-06 DIAGNOSIS — I495 Sick sinus syndrome: Secondary | ICD-10-CM | POA: Diagnosis not present

## 2022-09-07 LAB — CUP PACEART REMOTE DEVICE CHECK
Battery Remaining Longevity: 42 mo
Battery Remaining Percentage: 32 %
Battery Voltage: 2.96 V
Brady Statistic AP VP Percent: 1 %
Brady Statistic AP VS Percent: 8.6 %
Brady Statistic AS VP Percent: 1 %
Brady Statistic AS VS Percent: 90 %
Brady Statistic RA Percent Paced: 6.9 %
Brady Statistic RV Percent Paced: 1 %
Date Time Interrogation Session: 20240320022313
Implantable Lead Connection Status: 753985
Implantable Lead Connection Status: 753985
Implantable Lead Implant Date: 20160106
Implantable Lead Implant Date: 20160106
Implantable Lead Location: 753859
Implantable Lead Location: 753860
Implantable Pulse Generator Implant Date: 20160106
Lead Channel Impedance Value: 390 Ohm
Lead Channel Impedance Value: 510 Ohm
Lead Channel Pacing Threshold Amplitude: 0.5 V
Lead Channel Pacing Threshold Amplitude: 0.5 V
Lead Channel Pacing Threshold Pulse Width: 0.4 ms
Lead Channel Pacing Threshold Pulse Width: 0.4 ms
Lead Channel Sensing Intrinsic Amplitude: 12 mV
Lead Channel Sensing Intrinsic Amplitude: 2.4 mV
Lead Channel Setting Pacing Amplitude: 0.75 V
Lead Channel Setting Pacing Amplitude: 2 V
Lead Channel Setting Pacing Pulse Width: 0.4 ms
Lead Channel Setting Sensing Sensitivity: 2 mV
Pulse Gen Model: 2240
Pulse Gen Serial Number: 7705987

## 2022-09-21 IMAGING — MG MM DIGITAL SCREENING BILAT W/ TOMO AND CAD
8 series · 8 of 24 positions shown · non-contrast
Comparison: Previous exam(s).

CLINICAL DATA: Screening.

EXAM:
DIGITAL SCREENING BILATERAL MAMMOGRAM WITH TOMOSYNTHESIS AND CAD
TECHNIQUE: Bilateral screening digital craniocaudal and mediolateral oblique
mammograms were obtained. Bilateral screening digital breast
tomosynthesis was performed. The images were evaluated with
computer-aided detection.

[R MLO synth-2D]
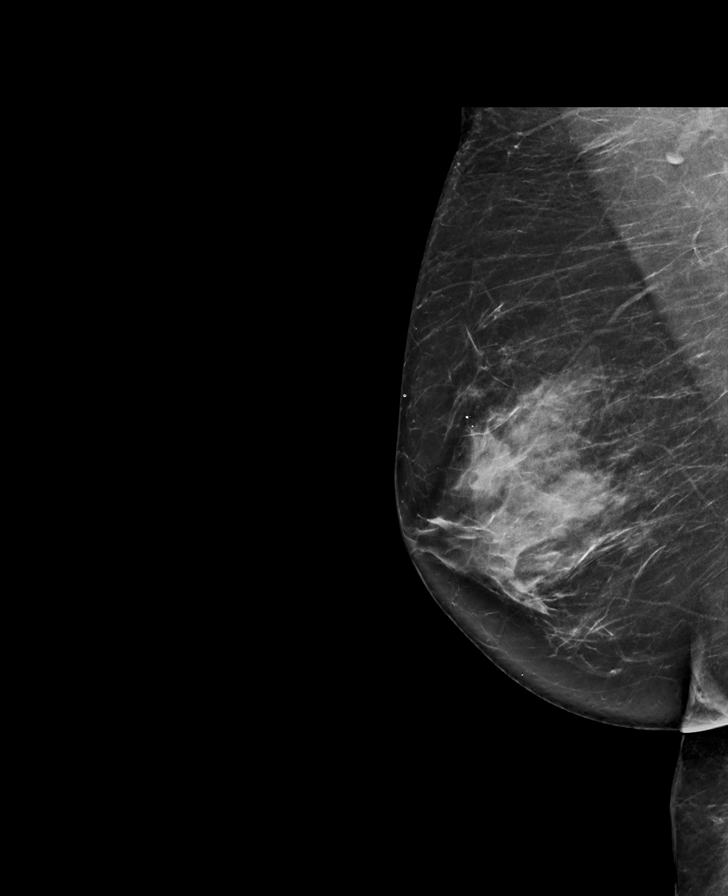

[L MLO synth-2D]
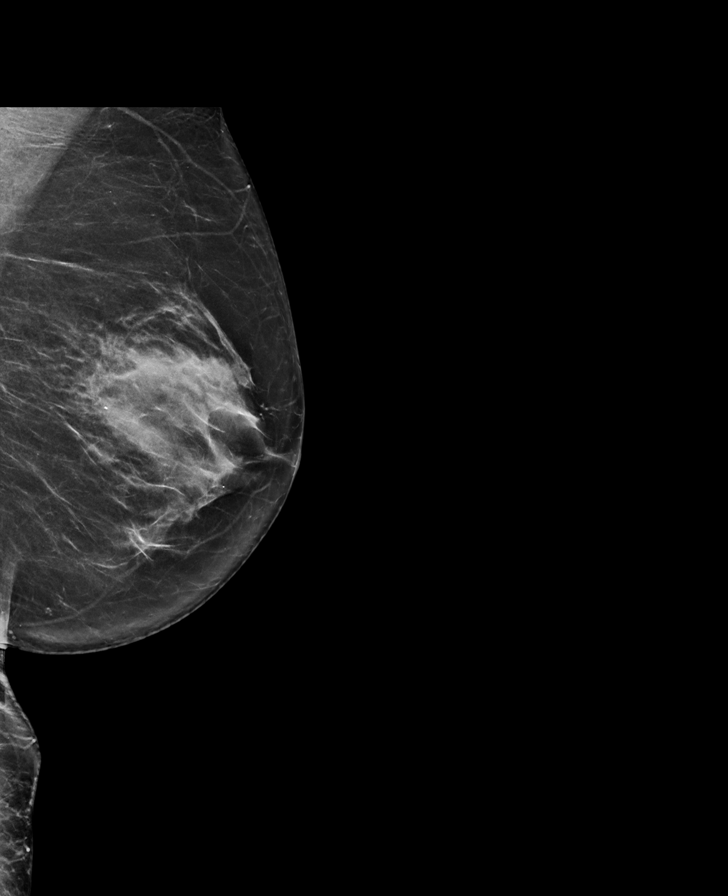

[L CC synth-2D]
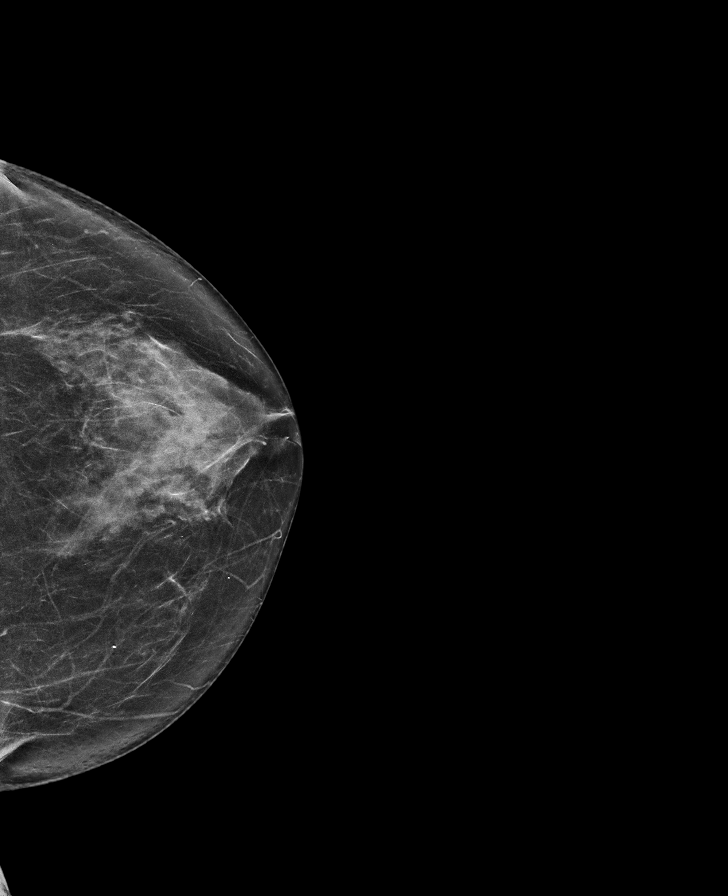

[R CC synth-2D]
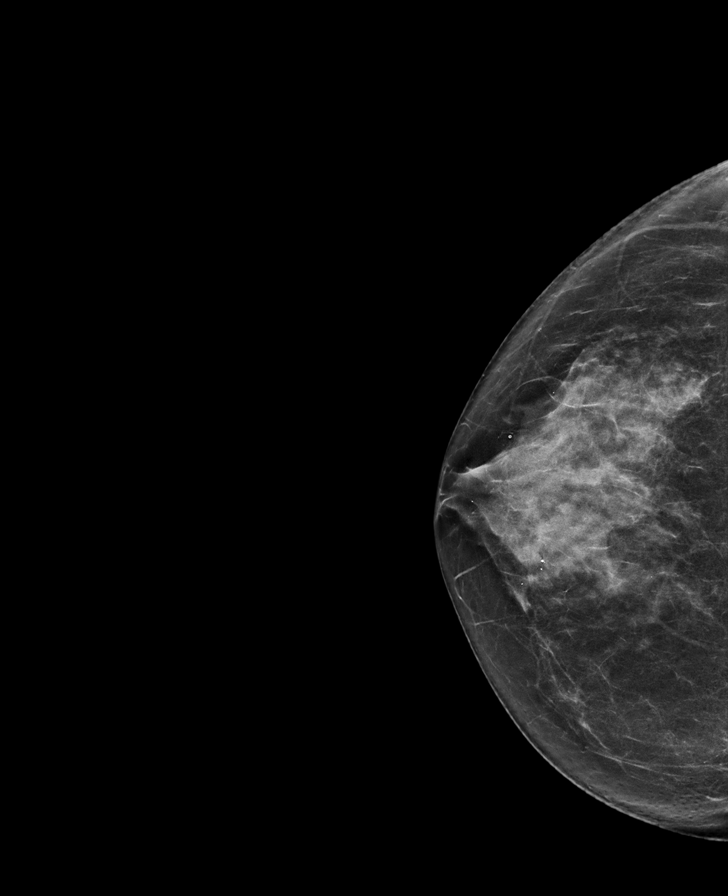

[R CC tomo · tomo slice 36/71.0]
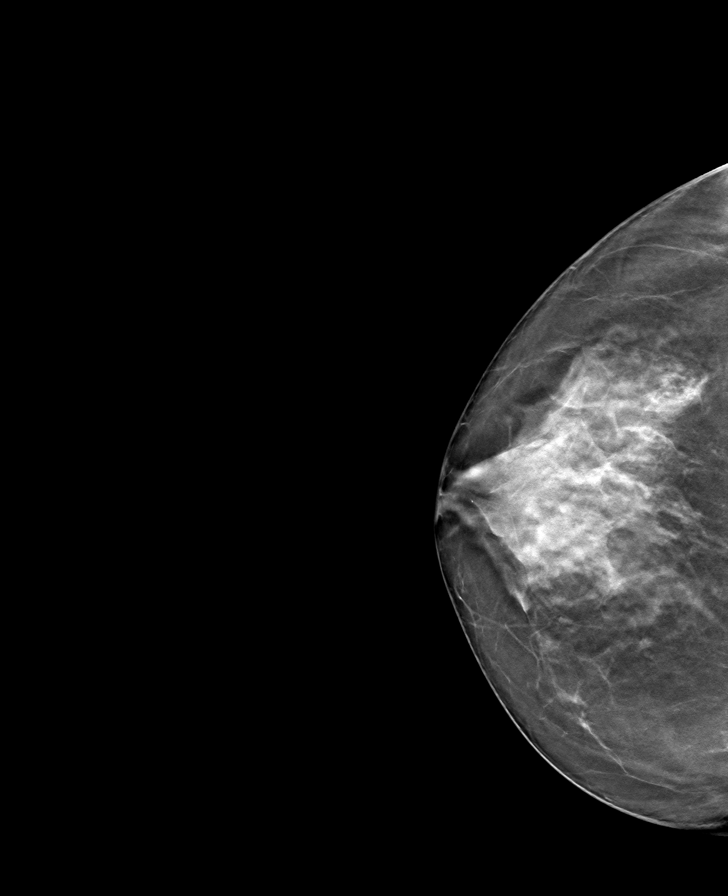

[L CC tomo · tomo slice 37/73.0]
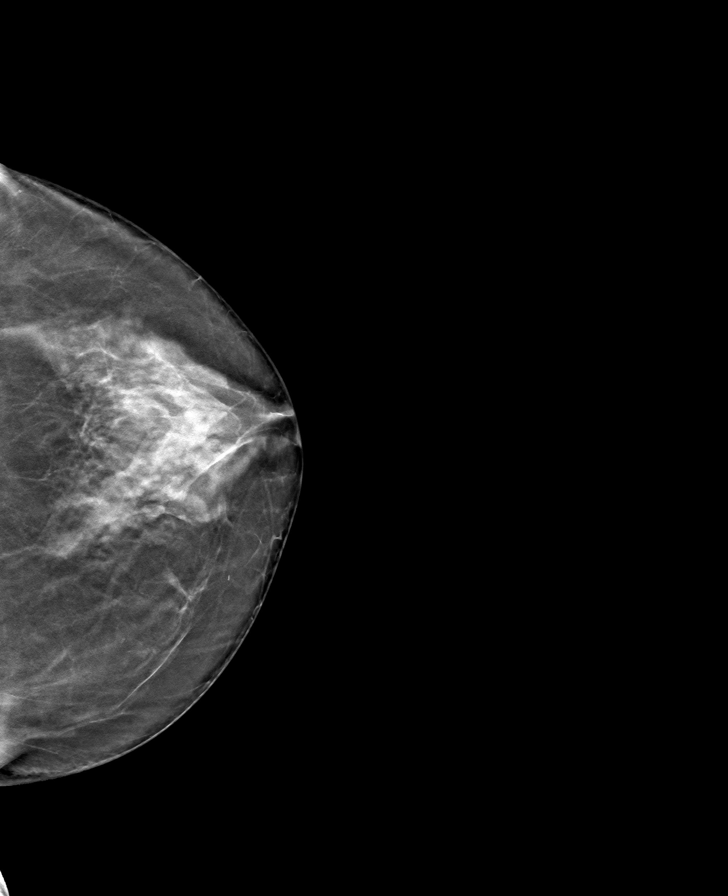

[R MLO tomo · tomo slice 40/79.0]
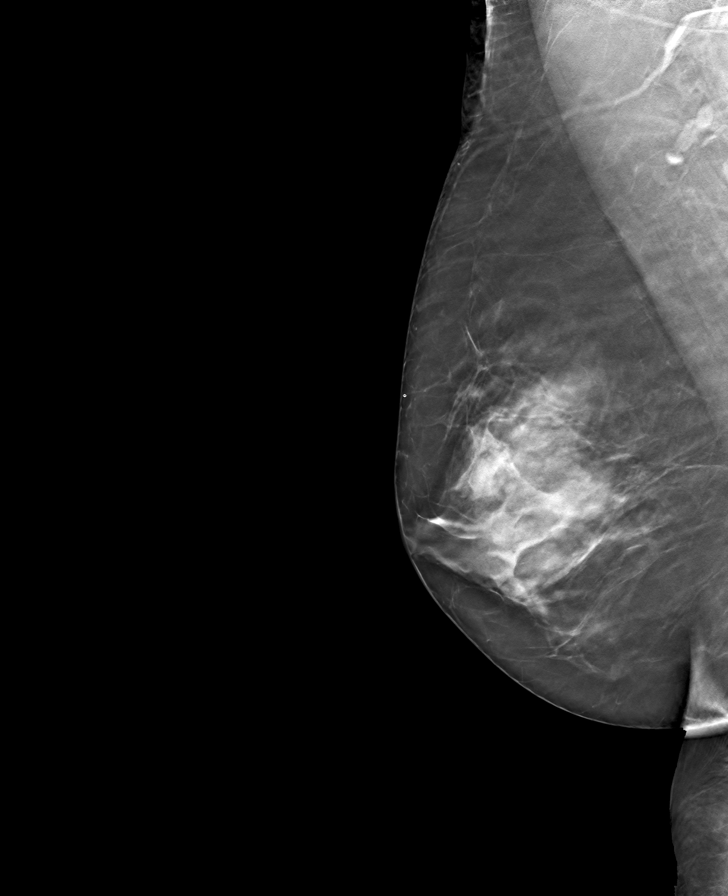

[L MLO tomo · tomo slice 37/74.0]
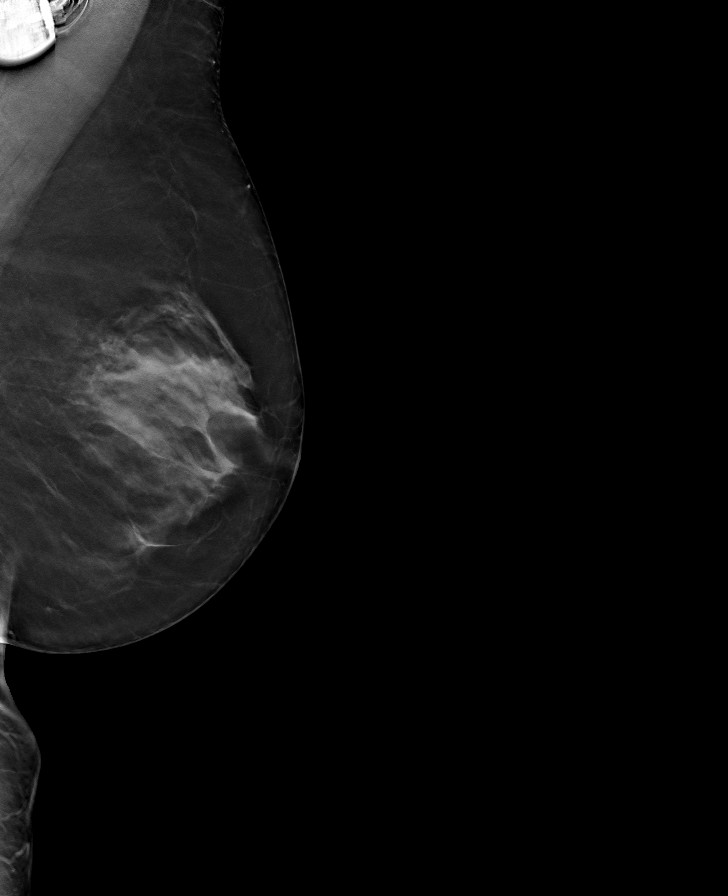

[8 of 24 positions shown; findings below may reference images not displayed]

ACR Breast Density Category c: The breast tissue is heterogeneously
dense, which may obscure small masses.
FINDINGS: There are no findings suspicious for malignancy.
IMPRESSION: No mammographic evidence of malignancy. A result letter of this
screening mammogram will be mailed directly to the patient.

RECOMMENDATION:
Screening mammogram in one year. (Code:Q3-W-BC3)

BI-RADS CATEGORY  1: Negative.

## 2022-10-16 NOTE — Progress Notes (Signed)
Remote pacemaker transmission.   

## 2022-12-05 DIAGNOSIS — I499 Cardiac arrhythmia, unspecified: Secondary | ICD-10-CM | POA: Diagnosis not present

## 2022-12-06 ENCOUNTER — Ambulatory Visit (INDEPENDENT_AMBULATORY_CARE_PROVIDER_SITE_OTHER): Payer: Medicare Other

## 2022-12-06 DIAGNOSIS — I495 Sick sinus syndrome: Secondary | ICD-10-CM | POA: Diagnosis not present

## 2022-12-07 LAB — CUP PACEART REMOTE DEVICE CHECK
Battery Remaining Longevity: 40 mo
Battery Remaining Percentage: 30 %
Battery Voltage: 2.96 V
Brady Statistic AP VP Percent: 1 %
Brady Statistic AP VS Percent: 7.9 %
Brady Statistic AS VP Percent: 1 %
Brady Statistic AS VS Percent: 91 %
Brady Statistic RA Percent Paced: 6.4 %
Brady Statistic RV Percent Paced: 1 %
Date Time Interrogation Session: 20240619020015
Implantable Lead Connection Status: 753985
Implantable Lead Connection Status: 753985
Implantable Lead Implant Date: 20160106
Implantable Lead Implant Date: 20160106
Implantable Lead Location: 753859
Implantable Lead Location: 753860
Implantable Pulse Generator Implant Date: 20160106
Lead Channel Impedance Value: 400 Ohm
Lead Channel Impedance Value: 530 Ohm
Lead Channel Pacing Threshold Amplitude: 0.5 V
Lead Channel Pacing Threshold Amplitude: 0.625 V
Lead Channel Pacing Threshold Pulse Width: 0.4 ms
Lead Channel Pacing Threshold Pulse Width: 0.4 ms
Lead Channel Sensing Intrinsic Amplitude: 12 mV
Lead Channel Sensing Intrinsic Amplitude: 2.9 mV
Lead Channel Setting Pacing Amplitude: 0.875
Lead Channel Setting Pacing Amplitude: 2 V
Lead Channel Setting Pacing Pulse Width: 0.4 ms
Lead Channel Setting Sensing Sensitivity: 2 mV
Pulse Gen Model: 2240
Pulse Gen Serial Number: 7705987

## 2023-02-20 DIAGNOSIS — Z780 Asymptomatic menopausal state: Secondary | ICD-10-CM | POA: Diagnosis not present

## 2023-02-20 DIAGNOSIS — E2839 Other primary ovarian failure: Secondary | ICD-10-CM | POA: Diagnosis not present

## 2023-02-20 DIAGNOSIS — Z13820 Encounter for screening for osteoporosis: Secondary | ICD-10-CM | POA: Diagnosis not present

## 2023-03-07 ENCOUNTER — Ambulatory Visit (INDEPENDENT_AMBULATORY_CARE_PROVIDER_SITE_OTHER): Payer: Medicare Other

## 2023-03-07 DIAGNOSIS — I495 Sick sinus syndrome: Secondary | ICD-10-CM

## 2023-03-07 LAB — CUP PACEART REMOTE DEVICE CHECK
Battery Remaining Longevity: 37 mo
Battery Remaining Percentage: 28 %
Battery Voltage: 2.95 V
Brady Statistic AP VP Percent: 1 %
Brady Statistic AP VS Percent: 8.4 %
Brady Statistic AS VP Percent: 1 %
Brady Statistic AS VS Percent: 90 %
Brady Statistic RA Percent Paced: 6.6 %
Brady Statistic RV Percent Paced: 1 %
Date Time Interrogation Session: 20240918020015
Implantable Lead Connection Status: 753985
Implantable Lead Connection Status: 753985
Implantable Lead Implant Date: 20160106
Implantable Lead Implant Date: 20160106
Implantable Lead Location: 753859
Implantable Lead Location: 753860
Implantable Pulse Generator Implant Date: 20160106
Lead Channel Impedance Value: 390 Ohm
Lead Channel Impedance Value: 550 Ohm
Lead Channel Pacing Threshold Amplitude: 0.5 V
Lead Channel Pacing Threshold Amplitude: 0.5 V
Lead Channel Pacing Threshold Pulse Width: 0.4 ms
Lead Channel Pacing Threshold Pulse Width: 0.4 ms
Lead Channel Sensing Intrinsic Amplitude: 12 mV
Lead Channel Sensing Intrinsic Amplitude: 2.5 mV
Lead Channel Setting Pacing Amplitude: 0.75 V
Lead Channel Setting Pacing Amplitude: 2 V
Lead Channel Setting Pacing Pulse Width: 0.4 ms
Lead Channel Setting Sensing Sensitivity: 2 mV
Pulse Gen Model: 2240
Pulse Gen Serial Number: 7705987

## 2023-03-13 NOTE — Progress Notes (Signed)
Remote pacemaker transmission.   

## 2023-03-22 NOTE — Progress Notes (Signed)
Remote pacemaker transmission.   

## 2023-04-03 DIAGNOSIS — M80031A Age-related osteoporosis with current pathological fracture, right forearm, initial encounter for fracture: Secondary | ICD-10-CM | POA: Diagnosis not present

## 2023-04-04 DIAGNOSIS — M80031A Age-related osteoporosis with current pathological fracture, right forearm, initial encounter for fracture: Secondary | ICD-10-CM | POA: Diagnosis not present

## 2023-05-02 DIAGNOSIS — R7989 Other specified abnormal findings of blood chemistry: Secondary | ICD-10-CM | POA: Diagnosis not present

## 2023-05-02 DIAGNOSIS — E559 Vitamin D deficiency, unspecified: Secondary | ICD-10-CM | POA: Diagnosis not present

## 2023-05-02 DIAGNOSIS — E538 Deficiency of other specified B group vitamins: Secondary | ICD-10-CM | POA: Diagnosis not present

## 2023-05-02 DIAGNOSIS — E7849 Other hyperlipidemia: Secondary | ICD-10-CM | POA: Diagnosis not present

## 2023-05-09 DIAGNOSIS — Z961 Presence of intraocular lens: Secondary | ICD-10-CM | POA: Diagnosis not present

## 2023-05-09 DIAGNOSIS — Z1331 Encounter for screening for depression: Secondary | ICD-10-CM | POA: Diagnosis not present

## 2023-05-09 DIAGNOSIS — I700 Atherosclerosis of aorta: Secondary | ICD-10-CM | POA: Diagnosis not present

## 2023-05-09 DIAGNOSIS — H18513 Endothelial corneal dystrophy, bilateral: Secondary | ICD-10-CM | POA: Diagnosis not present

## 2023-05-09 DIAGNOSIS — Z0000 Encounter for general adult medical examination without abnormal findings: Secondary | ICD-10-CM | POA: Diagnosis not present

## 2023-05-09 DIAGNOSIS — Z1339 Encounter for screening examination for other mental health and behavioral disorders: Secondary | ICD-10-CM | POA: Diagnosis not present

## 2023-05-10 DIAGNOSIS — M80031A Age-related osteoporosis with current pathological fracture, right forearm, initial encounter for fracture: Secondary | ICD-10-CM | POA: Diagnosis not present

## 2023-06-01 ENCOUNTER — Other Ambulatory Visit: Payer: Self-pay | Admitting: Internal Medicine

## 2023-06-01 DIAGNOSIS — Z1231 Encounter for screening mammogram for malignant neoplasm of breast: Secondary | ICD-10-CM

## 2023-06-06 ENCOUNTER — Ambulatory Visit (INDEPENDENT_AMBULATORY_CARE_PROVIDER_SITE_OTHER): Payer: Medicare Other

## 2023-06-06 DIAGNOSIS — I495 Sick sinus syndrome: Secondary | ICD-10-CM | POA: Diagnosis not present

## 2023-06-06 LAB — CUP PACEART REMOTE DEVICE CHECK
Battery Remaining Longevity: 34 mo
Battery Remaining Percentage: 26 %
Battery Voltage: 2.95 V
Brady Statistic AP VP Percent: 1 %
Brady Statistic AP VS Percent: 8.9 %
Brady Statistic AS VP Percent: 1 %
Brady Statistic AS VS Percent: 89 %
Brady Statistic RA Percent Paced: 6.7 %
Brady Statistic RV Percent Paced: 1 %
Date Time Interrogation Session: 20241218020015
Implantable Lead Connection Status: 753985
Implantable Lead Connection Status: 753985
Implantable Lead Implant Date: 20160106
Implantable Lead Implant Date: 20160106
Implantable Lead Location: 753859
Implantable Lead Location: 753860
Implantable Pulse Generator Implant Date: 20160106
Lead Channel Impedance Value: 410 Ohm
Lead Channel Impedance Value: 530 Ohm
Lead Channel Pacing Threshold Amplitude: 0.5 V
Lead Channel Pacing Threshold Amplitude: 0.625 V
Lead Channel Pacing Threshold Pulse Width: 0.4 ms
Lead Channel Pacing Threshold Pulse Width: 0.4 ms
Lead Channel Sensing Intrinsic Amplitude: 12 mV
Lead Channel Sensing Intrinsic Amplitude: 3.2 mV
Lead Channel Setting Pacing Amplitude: 0.875
Lead Channel Setting Pacing Amplitude: 2 V
Lead Channel Setting Pacing Pulse Width: 0.4 ms
Lead Channel Setting Sensing Sensitivity: 2 mV
Pulse Gen Model: 2240
Pulse Gen Serial Number: 7705987

## 2023-06-28 ENCOUNTER — Ambulatory Visit
Admission: RE | Admit: 2023-06-28 | Discharge: 2023-06-28 | Disposition: A | Discharge status: Home / Self Care | Payer: Medicare Other | Source: Ambulatory Visit | Attending: Internal Medicine | Admitting: Internal Medicine

## 2023-06-28 DIAGNOSIS — Z1231 Encounter for screening mammogram for malignant neoplasm of breast: Secondary | ICD-10-CM

## 2023-07-16 NOTE — Progress Notes (Signed)
Remote pacemaker transmission.

## 2023-07-30 DIAGNOSIS — E559 Vitamin D deficiency, unspecified: Secondary | ICD-10-CM | POA: Diagnosis not present

## 2023-08-14 DIAGNOSIS — Z1211 Encounter for screening for malignant neoplasm of colon: Secondary | ICD-10-CM | POA: Diagnosis not present

## 2023-09-05 ENCOUNTER — Ambulatory Visit (INDEPENDENT_AMBULATORY_CARE_PROVIDER_SITE_OTHER): Payer: Medicare Other

## 2023-09-05 DIAGNOSIS — I495 Sick sinus syndrome: Secondary | ICD-10-CM

## 2023-09-06 LAB — CUP PACEART REMOTE DEVICE CHECK
Battery Remaining Longevity: 31 mo
Battery Remaining Percentage: 24 %
Battery Voltage: 2.93 V
Brady Statistic AP VP Percent: 1 %
Brady Statistic AP VS Percent: 8.3 %
Brady Statistic AS VP Percent: 1 %
Brady Statistic AS VS Percent: 90 %
Brady Statistic RA Percent Paced: 6.3 %
Brady Statistic RV Percent Paced: 1 %
Date Time Interrogation Session: 20250319020015
Implantable Lead Connection Status: 753985
Implantable Lead Connection Status: 753985
Implantable Lead Implant Date: 20160106
Implantable Lead Implant Date: 20160106
Implantable Lead Location: 753859
Implantable Lead Location: 753860
Implantable Pulse Generator Implant Date: 20160106
Lead Channel Impedance Value: 380 Ohm
Lead Channel Impedance Value: 530 Ohm
Lead Channel Pacing Threshold Amplitude: 0.5 V
Lead Channel Pacing Threshold Amplitude: 0.625 V
Lead Channel Pacing Threshold Pulse Width: 0.4 ms
Lead Channel Pacing Threshold Pulse Width: 0.4 ms
Lead Channel Sensing Intrinsic Amplitude: 12 mV
Lead Channel Sensing Intrinsic Amplitude: 3.6 mV
Lead Channel Setting Pacing Amplitude: 0.875
Lead Channel Setting Pacing Amplitude: 2 V
Lead Channel Setting Pacing Pulse Width: 0.4 ms
Lead Channel Setting Sensing Sensitivity: 2 mV
Pulse Gen Model: 2240
Pulse Gen Serial Number: 7705987

## 2023-09-10 ENCOUNTER — Encounter: Payer: Self-pay | Admitting: Internal Medicine

## 2023-10-19 NOTE — Progress Notes (Signed)
 Remote pacemaker transmission.

## 2023-10-19 NOTE — Addendum Note (Signed)
 Addended by: Lott Rouleau A on: 10/19/2023 12:11 PM   Modules accepted: Orders

## 2023-12-05 ENCOUNTER — Ambulatory Visit (INDEPENDENT_AMBULATORY_CARE_PROVIDER_SITE_OTHER): Payer: Medicare Other

## 2023-12-05 DIAGNOSIS — I495 Sick sinus syndrome: Secondary | ICD-10-CM

## 2023-12-06 ENCOUNTER — Ambulatory Visit: Payer: Self-pay | Admitting: Internal Medicine

## 2023-12-06 LAB — CUP PACEART REMOTE DEVICE CHECK
Battery Remaining Longevity: 28 mo
Battery Remaining Percentage: 22 %
Battery Voltage: 2.93 V
Brady Statistic AP VP Percent: 1 %
Brady Statistic AP VS Percent: 8.3 %
Brady Statistic AS VP Percent: 1 %
Brady Statistic AS VS Percent: 90 %
Brady Statistic RA Percent Paced: 6.2 %
Brady Statistic RV Percent Paced: 1 %
Date Time Interrogation Session: 20250619074705
Implantable Lead Connection Status: 753985
Implantable Lead Connection Status: 753985
Implantable Lead Implant Date: 20160106
Implantable Lead Implant Date: 20160106
Implantable Lead Location: 753859
Implantable Lead Location: 753860
Implantable Pulse Generator Implant Date: 20160106
Lead Channel Impedance Value: 380 Ohm
Lead Channel Impedance Value: 510 Ohm
Lead Channel Pacing Threshold Amplitude: 0.5 V
Lead Channel Pacing Threshold Amplitude: 0.75 V
Lead Channel Pacing Threshold Pulse Width: 0.4 ms
Lead Channel Pacing Threshold Pulse Width: 0.4 ms
Lead Channel Sensing Intrinsic Amplitude: 1.7 mV
Lead Channel Sensing Intrinsic Amplitude: 12 mV
Lead Channel Setting Pacing Amplitude: 1 V
Lead Channel Setting Pacing Amplitude: 2 V
Lead Channel Setting Pacing Pulse Width: 0.4 ms
Lead Channel Setting Sensing Sensitivity: 2 mV
Pulse Gen Model: 2240
Pulse Gen Serial Number: 7705987

## 2023-12-28 DIAGNOSIS — M25532 Pain in left wrist: Secondary | ICD-10-CM | POA: Diagnosis not present

## 2024-02-05 DIAGNOSIS — I495 Sick sinus syndrome: Secondary | ICD-10-CM | POA: Diagnosis not present

## 2024-02-14 NOTE — Progress Notes (Signed)
 Remote pacemaker transmission.

## 2024-02-14 NOTE — Addendum Note (Signed)
 Addended by: VICCI SELLER A on: 02/14/2024 10:17 AM   Modules accepted: Orders

## 2024-03-05 ENCOUNTER — Ambulatory Visit (INDEPENDENT_AMBULATORY_CARE_PROVIDER_SITE_OTHER): Payer: Medicare Other

## 2024-03-05 DIAGNOSIS — I495 Sick sinus syndrome: Secondary | ICD-10-CM

## 2024-03-06 LAB — CUP PACEART REMOTE DEVICE CHECK
Battery Remaining Longevity: 26 mo
Battery Remaining Percentage: 20 %
Battery Voltage: 2.92 V
Brady Statistic AP VP Percent: 1 %
Brady Statistic AP VS Percent: 8.4 %
Brady Statistic AS VP Percent: 1 %
Brady Statistic AS VS Percent: 90 %
Brady Statistic RA Percent Paced: 6.6 %
Brady Statistic RV Percent Paced: 1 %
Date Time Interrogation Session: 20250917062727
Implantable Lead Connection Status: 753985
Implantable Lead Connection Status: 753985
Implantable Lead Implant Date: 20160106
Implantable Lead Implant Date: 20160106
Implantable Lead Location: 753859
Implantable Lead Location: 753860
Implantable Pulse Generator Implant Date: 20160106
Lead Channel Impedance Value: 380 Ohm
Lead Channel Impedance Value: 530 Ohm
Lead Channel Pacing Threshold Amplitude: 0.5 V
Lead Channel Pacing Threshold Amplitude: 0.5 V
Lead Channel Pacing Threshold Pulse Width: 0.4 ms
Lead Channel Pacing Threshold Pulse Width: 0.4 ms
Lead Channel Sensing Intrinsic Amplitude: 12 mV
Lead Channel Sensing Intrinsic Amplitude: 3.1 mV
Lead Channel Setting Pacing Amplitude: 0.75 V
Lead Channel Setting Pacing Amplitude: 2 V
Lead Channel Setting Pacing Pulse Width: 0.4 ms
Lead Channel Setting Sensing Sensitivity: 2 mV
Pulse Gen Model: 2240
Pulse Gen Serial Number: 7705987

## 2024-03-10 NOTE — Progress Notes (Signed)
 Remote PPM Transmission

## 2024-03-15 ENCOUNTER — Ambulatory Visit: Payer: Self-pay | Admitting: Internal Medicine

## 2024-05-07 DIAGNOSIS — E7849 Other hyperlipidemia: Secondary | ICD-10-CM | POA: Diagnosis not present

## 2024-05-07 DIAGNOSIS — E538 Deficiency of other specified B group vitamins: Secondary | ICD-10-CM | POA: Diagnosis not present

## 2024-05-20 DIAGNOSIS — Z1331 Encounter for screening for depression: Secondary | ICD-10-CM | POA: Diagnosis not present

## 2024-05-20 DIAGNOSIS — Z1339 Encounter for screening examination for other mental health and behavioral disorders: Secondary | ICD-10-CM | POA: Diagnosis not present

## 2024-05-20 DIAGNOSIS — I700 Atherosclerosis of aorta: Secondary | ICD-10-CM | POA: Diagnosis not present

## 2024-05-20 DIAGNOSIS — Z0000 Encounter for general adult medical examination without abnormal findings: Secondary | ICD-10-CM | POA: Diagnosis not present

## 2024-05-28 DIAGNOSIS — H18513 Endothelial corneal dystrophy, bilateral: Secondary | ICD-10-CM | POA: Diagnosis not present

## 2024-05-28 DIAGNOSIS — Z961 Presence of intraocular lens: Secondary | ICD-10-CM | POA: Diagnosis not present

## 2024-05-29 ENCOUNTER — Encounter (HOSPITAL_BASED_OUTPATIENT_CLINIC_OR_DEPARTMENT_OTHER): Payer: Self-pay | Admitting: Emergency Medicine

## 2024-05-29 ENCOUNTER — Other Ambulatory Visit: Payer: Self-pay

## 2024-05-29 ENCOUNTER — Emergency Department (HOSPITAL_BASED_OUTPATIENT_CLINIC_OR_DEPARTMENT_OTHER)
Admission: EM | Admit: 2024-05-29 | Discharge: 2024-05-29 | Disposition: A | Discharge status: Home / Self Care | Attending: Emergency Medicine | Admitting: Emergency Medicine

## 2024-05-29 DIAGNOSIS — N939 Abnormal uterine and vaginal bleeding, unspecified: Secondary | ICD-10-CM | POA: Diagnosis present

## 2024-05-29 DIAGNOSIS — N814 Uterovaginal prolapse, unspecified: Secondary | ICD-10-CM | POA: Diagnosis not present

## 2024-05-29 DIAGNOSIS — N819 Female genital prolapse, unspecified: Secondary | ICD-10-CM | POA: Diagnosis not present

## 2024-05-29 DIAGNOSIS — Z950 Presence of cardiac pacemaker: Secondary | ICD-10-CM | POA: Diagnosis not present

## 2024-05-29 LAB — URINALYSIS, ROUTINE W REFLEX MICROSCOPIC
Bacteria, UA: NONE SEEN
Bilirubin Urine: NEGATIVE
Glucose, UA: NEGATIVE mg/dL
Ketones, ur: NEGATIVE mg/dL
Nitrite: NEGATIVE
Protein, ur: NEGATIVE mg/dL
Specific Gravity, Urine: 1.014 (ref 1.005–1.030)
pH: 6.5 (ref 5.0–8.0)

## 2024-05-29 MED ORDER — ESTRADIOL 0.01 % VA CREA: TOPICAL_CREAM | VAGINAL | 12 refills | Status: AC

## 2024-05-29 NOTE — ED Notes (Signed)
 Pt aware of the need for a urine... Pt currently unable to provide the sample.SABRASABRA

## 2024-05-29 NOTE — ED Triage Notes (Signed)
 Pt endorses concern for vaginal prolapse yesterday. Reports mild bleeding

## 2024-05-29 NOTE — ED Notes (Signed)
 DC paperwork given and verbally understood.

## 2024-05-29 NOTE — ED Provider Notes (Signed)
 [place] [facility] AT [facility] Provider Note   CSN: [id number] Arrival date & time: 05/29/24  9063     Patient presents with: Vaginal Bleeding   [name] is a 78 y.o. female.   Pt is a 78 yo female with pmhx significant for sinus node dysfunction s/p pacemaker placement.  Pt presents to the ED today with vagina prolapse.  Pt noticed it yesterday.  She is able to push it back in, but it comes right back out.  She noticed some bleeding today.  She has urinary sx of frequency as well.       Prior to Admission medications  Medication Sig Start Date End Date Taking? Authorizing Provider  estradiol  (ESTRACE ) 0.01 % CREA vaginal cream Apply 1 gram per vagina every night for 2 weeks, then apply 2-3 times a week 05/29/24  Yes Cleatus Moccasin, MD  Calcium -Magnesium-Vitamin D (CALCIUM  MAGNESIUM PO) Take 1 capsule by mouth daily.    [provider]  Cholecalciferol (VITAMIN D3) 5000 UNITS CAPS Take 1 capsule by mouth daily.    [provider]  ibuprofen (ADVIL,MOTRIN) 200 MG tablet Take 400 mg by mouth every 6 (six) hours as needed (pain).     [provider]  Multiple Vitamin (MULTIVITAMIN) tablet Take 1 tablet by mouth daily as needed (Vitamin supplement).     [provider]  Omega-3 Fatty Acids (FISH OIL PO) Take 900 mg by mouth daily.    [provider]    Allergies: Patient has no known allergies.    Review of Systems  Genitourinary:  Positive for frequency.  All other systems reviewed and are negative.   Updated Vital Signs BP 124/69 (BP Location: Right Arm)   Pulse 70   Temp 98.1 F (36.7 C)   Resp 16   Wt 65.8 kg   SpO2 99%   BMI 25.69 kg/m   Physical Exam Vitals and nursing note reviewed. Exam conducted with a chaperone present.  Constitutional:      Appearance: Normal appearance.  HENT:     Head: Normocephalic and atraumatic.     Right Ear: External ear normal.     Left Ear: External ear  normal.     Nose: Nose normal.     Mouth/Throat:     Mouth: Mucous membranes are moist.     Pharynx: Oropharynx is clear.  Eyes:     Extraocular Movements: Extraocular movements intact.     Conjunctiva/sclera: Conjunctivae normal.     Pupils: Pupils are equal, round, and reactive to light.  Cardiovascular:     Rate and Rhythm: Normal rate and regular rhythm.     Pulses: Normal pulses.     Heart sounds: Normal heart sounds.  Pulmonary:     Effort: Pulmonary effort is normal.     Breath sounds: Normal breath sounds.  Abdominal:     General: Abdomen is flat. Bowel sounds are normal.     Palpations: Abdomen is soft.  Genitourinary:    Comments: Uterine prolapse.  Erosions to cervix. Musculoskeletal:     Cervical back: Normal range of motion and neck supple.  Skin:    General: Skin is warm.     Capillary Refill: Capillary refill takes less than 2 seconds.  Neurological:     General: No focal deficit present.     Mental Status: She is alert and oriented to person, place, and time.  Psychiatric:        Mood and Affect: Mood normal.  Behavior: Behavior normal.     (all labs ordered are listed, but only abnormal results are displayed) Labs Reviewed  URINALYSIS, ROUTINE W REFLEX MICROSCOPIC - Abnormal; Notable for the following components:      Result Value   Hgb urine dipstick MODERATE (*)    Leukocytes,Ua SMALL (*)    All other components within normal limits    EKG: None  Radiology: No results found.   Procedures   Medications Ordered in the ED - No data to display                                  Medical Decision Making Amount and/or Complexity of Data Reviewed Labs: ordered.   This patient presents to the ED for concern of prolapse, this involves an extensive number of treatment options, and is a complaint that carries with it a high risk of complications and morbidity.  The differential diagnosis includes bladder vs uterine prolapse   Co morbidities  that complicate the patient evaluation  sinus node dysfunction s/p pacemaker placement   Additional history obtained:  Additional history obtained from epic chart review  Medicines ordered and prescription drug management:  I have reviewed the patients home medicines and have made adjustments as needed  Consultations Obtained:  I requested consultation with Dr. Cleatus (gyn),  and discussed lab and imaging findings as well as pertinent plan - she put in a referral for urogyn and for estrogen cream   Problem List / ED Course:  Uterine prolapse:  uterus is reducible.  Pt d/w Gyn who put in referral for urogyn and sent in a rx for estrogen cream.  Pt is stable for d/c.  Return if worse.    Reevaluation:  After the interventions noted above, I reevaluated the patient and found that they have :improved   Social Determinants of Health:  Lives at home   Dispostion:  After consideration of the diagnostic results and the patients response to treatment, I feel that the patent would benefit from discharge with outpatient f/u.       Final diagnoses:  Uterine prolapse    ED Discharge Orders          Ordered    Ambulatory referral to Urogynecology       Comments: Pelvic organ prolapse   05/29/24 1050    estradiol  (ESTRACE ) 0.01 % CREA vaginal cream        05/29/24 1050               Dean Clarity, MD 05/29/24 1111

## 2024-06-04 ENCOUNTER — Ambulatory Visit: Payer: Medicare Other

## 2024-06-04 ENCOUNTER — Other Ambulatory Visit: Payer: Self-pay | Admitting: Internal Medicine

## 2024-06-04 DIAGNOSIS — I495 Sick sinus syndrome: Secondary | ICD-10-CM | POA: Diagnosis not present

## 2024-06-04 DIAGNOSIS — Z1231 Encounter for screening mammogram for malignant neoplasm of breast: Secondary | ICD-10-CM

## 2024-06-05 LAB — CUP PACEART REMOTE DEVICE CHECK
Battery Remaining Longevity: 23 mo
Battery Remaining Percentage: 18 %
Battery Voltage: 2.9 V
Brady Statistic AP VP Percent: 1 %
Brady Statistic AP VS Percent: 8.1 %
Brady Statistic AS VP Percent: 1 %
Brady Statistic AS VS Percent: 90 %
Brady Statistic RA Percent Paced: 6.4 %
Brady Statistic RV Percent Paced: 1 %
Date Time Interrogation Session: 20251217020903
Implantable Lead Connection Status: 753985
Implantable Lead Connection Status: 753985
Implantable Lead Implant Date: 20160106
Implantable Lead Implant Date: 20160106
Implantable Lead Location: 753859
Implantable Lead Location: 753860
Implantable Pulse Generator Implant Date: 20160106
Lead Channel Impedance Value: 400 Ohm
Lead Channel Impedance Value: 540 Ohm
Lead Channel Pacing Threshold Amplitude: 0.5 V
Lead Channel Pacing Threshold Amplitude: 0.625 V
Lead Channel Pacing Threshold Pulse Width: 0.4 ms
Lead Channel Pacing Threshold Pulse Width: 0.4 ms
Lead Channel Sensing Intrinsic Amplitude: 12 mV
Lead Channel Sensing Intrinsic Amplitude: 2.7 mV
Lead Channel Setting Pacing Amplitude: 0.875
Lead Channel Setting Pacing Amplitude: 2 V
Lead Channel Setting Pacing Pulse Width: 0.4 ms
Lead Channel Setting Sensing Sensitivity: 2 mV
Pulse Gen Model: 2240
Pulse Gen Serial Number: 7705987

## 2024-06-05 NOTE — Progress Notes (Signed)
 Remote PPM Transmission

## 2024-06-15 ENCOUNTER — Ambulatory Visit: Payer: Self-pay | Admitting: Internal Medicine

## 2024-07-01 ENCOUNTER — Ambulatory Visit
Admission: RE | Admit: 2024-07-01 | Discharge: 2024-07-01 | Disposition: A | Source: Ambulatory Visit | Attending: Internal Medicine

## 2024-07-01 DIAGNOSIS — Z1231 Encounter for screening mammogram for malignant neoplasm of breast: Secondary | ICD-10-CM
